# Patient Record
Sex: Male | Born: 1935
Health system: Southern US, Community
[De-identification: ages and names within clinical notes are randomized; demographics above are authoritative.]

## PROBLEM LIST (undated history)

## (undated) ENCOUNTER — Emergency Department (HOSPITAL_COMMUNITY): Payer: Medicare Other

## (undated) DIAGNOSIS — F0390 Unspecified dementia without behavioral disturbance: Secondary | ICD-10-CM

## (undated) DIAGNOSIS — I10 Essential (primary) hypertension: Secondary | ICD-10-CM

## (undated) DIAGNOSIS — E119 Type 2 diabetes mellitus without complications: Secondary | ICD-10-CM

## (undated) DIAGNOSIS — I2510 Atherosclerotic heart disease of native coronary artery without angina pectoris: Secondary | ICD-10-CM

## (undated) DIAGNOSIS — I4891 Unspecified atrial fibrillation: Secondary | ICD-10-CM

## (undated) HISTORY — DX: Unspecified atrial fibrillation: I48.91

## (undated) HISTORY — DX: Type 2 diabetes mellitus without complications: E11.9

## (undated) HISTORY — DX: Unspecified dementia, unspecified severity, without behavioral disturbance, psychotic disturbance, mood disturbance, and anxiety: F03.90

## (undated) HISTORY — PX: CORONARY ANGIOPLASTY: SHX604

## (undated) HISTORY — DX: Atherosclerotic heart disease of native coronary artery without angina pectoris: I25.10

## (undated) HISTORY — DX: Essential (primary) hypertension: I10

---

## 1999-03-17 ENCOUNTER — Ambulatory Visit (HOSPITAL_COMMUNITY): Admission: RE | Admit: 1999-03-17 | Discharge: 1999-03-17 | Payer: Self-pay | Admitting: *Deleted

## 2000-02-10 ENCOUNTER — Encounter: Payer: Self-pay | Admitting: Cardiology

## 2000-02-10 ENCOUNTER — Inpatient Hospital Stay (HOSPITAL_COMMUNITY): Admission: EM | Admit: 2000-02-10 | Discharge: 2000-02-13 | Payer: Self-pay | Admitting: Emergency Medicine

## 2000-03-05 ENCOUNTER — Encounter (HOSPITAL_COMMUNITY): Admission: RE | Admit: 2000-03-05 | Discharge: 2000-06-03 | Payer: Self-pay | Admitting: Cardiology

## 2000-05-09 ENCOUNTER — Encounter: Payer: Self-pay | Admitting: Emergency Medicine

## 2000-05-09 ENCOUNTER — Inpatient Hospital Stay (HOSPITAL_COMMUNITY): Admission: EM | Admit: 2000-05-09 | Discharge: 2000-05-15 | Payer: Self-pay | Admitting: Emergency Medicine

## 2007-12-19 ENCOUNTER — Encounter: Admission: RE | Admit: 2007-12-19 | Discharge: 2007-12-19 | Payer: Self-pay | Admitting: Family Medicine

## 2008-01-12 ENCOUNTER — Ambulatory Visit (HOSPITAL_COMMUNITY): Admission: RE | Admit: 2008-01-12 | Discharge: 2008-01-12 | Payer: Self-pay | Admitting: Gastroenterology

## 2010-12-19 NOTE — Op Note (Signed)
NAME:  Peter Martin, Peter Martin NO.:  [id number]   MEDICAL RECORD NO.:  [id number]           PATIENT TYPE:   LOCATION:                                 FACILITY:   PHYSICIAN:  Shirley Friar, MDDATE OF BIRTH:  Apr 05, 1930   DATE OF PROCEDURE:  DATE OF DISCHARGE:                               OPERATIVE REPORT   INDICATIONS:  Dysphagia, abnormal barium swallow which showed small  hiatal hernia with lodging of barium pill above the level of the hiatal  hernia.   MEDICINES:  1. Fentanyl 75 mcg IV.  2. Versed 7.5 mg IV.  3. Cetacaine spray.   FINDINGS:  Endoscope was inserted through the oropharynx and esophagus  was intubated.  The distal esophagus was with circumferential benign-  appearing ring, which initially prevented passage of the 9.8-mm  endoscope.  Moderate pressure was applied with this endoscope and the  endoscope was able to traverse through the esophageal ring with evidence  of dilation from the endoscope.  Endoscope was advanced down to the  stomach, which revealed slightly hypopigmented mucosa in the distal  stomach, but no ulcers or erosions were seen.  Retroflexion was done,  which revealed small hiatal hernia and active oozing of blood from the  GE junction, status post dilation with the endoscope.  Endoscope was  advanced down to the duodenal bulb and second portion of the duodenum,  which were both normal.  Endoscope was withdrawn back in the stomach and  a guidewire was inserted through the working channel of the endoscope in  an one-to-one fashion.  Fluoroscopy was used to help with placement of  the guidewire as well as with dilation of the esophageal ring.  Savary  dilators were used starting with 11 mm followed by 12.8 mm and then 14  mm with use of fluoroscopy and traversing of the GE junction.  Heme was  noted on all 3 dilators with minimal resistance noted on the 12.8-mm and  14-mm dilators.  The patient tolerated the procedure well with  no  immediate complications.   ASSESSMENT:  1. Distal esophageal ring, status post Savary dilation with      fluoroscopy up to 14 mm.  2. Small hiatal hernia.   PLAN:  1. Liquid diet and advance as tolerated.  2. The patient needs to start a proton pump inhibitor each day and may      need it to stay on indefinitely.  3. Follow up as needed depending on symptoms.     Shirley Friar, MD  Electronically Signed    VCS/MEDQ  D:  01/12/2008  T:  01/13/2008  Job:  [id number]   cc:   Windle Guard, M.D.

## 2010-12-22 NOTE — H&P (Signed)
[place]. [facility]  Patient:    Peter Martin, Peter Martin                         MRN: [id number] Adm. Date:  [id number] Attending:  Norman Clay CC:         Darden Palmer., M.D.  Hadassah Pais. Jeannetta Nap, M.D.   History and Physical  REASON FOR ADMISSION:  Chest tightness/unstable angina.  HISTORY OF PRESENT ILLNESS:  Mr. Boule is a 77 year old male with known ASCVD, including a 70-80% second diagonal stenosis, calcified 50% lesion in the circumflex.  He developed mid- to upper substernal chest tightness radiating across the chest around 3 oclock this afternoon.  It continued intermittently until now in the emergency room at approximately 1830.  It is associated with mild dyspnea and "head fullness," which is typical for him.  These symptoms began in July, resulting in a cardiac catheterization with the above findings. A decision at the time was made for medical therapy, including Norvasc, metoprolol, aspirin, and Plavix.  Despite this, he has had several episodes of discomfort, sometimes relieved with nitroglycerin but on other occasions nitroglycerin has caused profound weakness and lightheadedness. Interestingly, he is currently attending cardiac rehab and exercising without symptoms routinely.  PAST MEDICAL HISTORY: 1. Hypertension. 2. Hyperglycemia without a full diagnosis of diabetes mellitus. 3. Peptic ulcer disease/gastritis while on aspirin with subsequent GI    bleeding. 4. Hypercholesterolemia.  PAST SURGICAL HISTORY:  None.  SOCIAL HISTORY:  The patient is married and accompanied by his wife in the emergency room this afternoon.  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  He is a nonsmoker, does not drink any alcohol.  They live in a single-family home in the _____ area.  FAMILY HISTORY:  Father died at age 56 with stroke.  Mother died of heart failure at age 2.  No family history of early coronary disease.  MEDICATIONS:   Univasc 15 mg q.d., Norvasc 10 mg p.o. q.d., Lopressor 50 mg p.o. b.i.d., _____ p.o. q.d., aspirin ? dosage p.o. q.d., Plavix 75 mg p.o. q.d., SL nitroglycerin 0.4 mg p.r.n.  DRUG ALLERGIES:  PENICILLIN causes a rash.  REVIEW OF SYSTEMS:  The patient denies any recent fever or chills, malaise, weight loss, or weight gain.  He has the head fullness previously mentioned but no frequent problem with headaches.  He has poor vision in both eyes and wears glasses.  Hearing in both ears is good.  Teeth and gums in good repair. No difficulty with swallowing.  No history of hematemesis, hematochezia, or melena.  Does have a lot of abdominal fullness, "gas," currently.  He has no problems with dysuria or hematuria, no nocturia, difficulty starting or stopping the stream.  He has no muscle weakness or major joint pain or swelling or stiffness.  Denies any dysesthesias or paresthesias.  No history of seizure disorder or stroke.  PHYSICAL EXAMINATION:  VITAL SIGNS:  The blood pressure is 187/90 on admission, temperature 98.0, respirations 16, pulse 60, and frequent extrasystoles.  GENERAL:  This is a 77 year old man who is alert and oriented and cooperative, in no distress.  HEENT:  Head is atraumatic and normocephalic.  Pupils equal, round and reactive to light and accommodation.  A faint arcus senilis is present. Extraocular movements are intact.  Sclerae are anicteric.  Oral mucosa is pink and moist.  Teeth and gums in good repair.  The tongue is not coated.  NECK:  Supple without thyromegaly or masses.  Carotid upstrokes are normal. There is no bruit.  There is no jugular venous distention.  CHEST:  Clear with adequate excursion bilaterally, normal vesicular breath sounds are heard throughout.  HEART:  The precordium is quiet.  Normal S1 and S2 is heard.  Frequent extrasystole.  Occasional bigeminal rhythm.  No murmur, click, or rub.  ABDOMEN:  Soft, flat, nontender, no  hepatosplenomegaly or midline pulsatile masses.  Bowel sounds are present in all quadrants.  GENITALIA:  Normal male phallus.  Descended testicles.  No lesions.  EXTREMITIES:  Full range of motion, no edema.  Intact femoral, popliteal, and posterior tibial pulses bilaterally.  No bruits.  RECTAL:  Not performed.  NEUROLOGIC:  Cranial nerves II-XII are grossly intact.  Motor and sensory grossly intact.  Gait not tested.  SKIN:  Warm, dry, and clear.  ACCESSORY CLINICAL DATA:  Admission hemogram shows a hematocrit of 40.4, hemoglobin 14.5, white blood cell count 6000, platelet count 170,000.  Total CK 75, MB 1.5, troponin I 0.01.  Serum electrolytes showed a sodium to be 140, potassium 3.4, chloride 101, creatinine 1.2, BUN 23, glucose 93.  Electrocardiogram shows sinus bradycardia, LVH voltage, and T-wave inversion in V1 to V3.  There are Q-waves in V5 and V6, not felt to be significant. Chest x-ray is pending.  FINAL IMPRESSION:  A 77 year old man with known atherosclerotic cardiovascular disease, who presents with atypical angina in an unstable pattern.  Also hypertensive and relates a history of what sounds like significant blood pressure drops with sublingual nitroglycerin administration.  PLAN:  Admit for rule-out of myocardial infarction with serial CK-MB and troponin enzymes, repeat ECG, IV nitroglycerin, subcutaneous Lovenox, aspirin, beta blocker, and Norvasc.  Further evaluation and treatment to be based on re-evaluation by Dr. Viann Fish. DD:  05/09/00 TD:  05/10/00 Job: [id number]

## 2010-12-22 NOTE — Discharge Summary (Signed)
[place]. [facility]  Patient:    Peter Martin, Peter Martin                         MRN: [id number] Adm. Date:  [id number] Disc. Date: [id number] Attending:  Norman Clay CC:         Hadassah Pais. Jeannetta Nap, M.D.   Referring Physician Discharge [place]  FINAL DIAGNOSES 1. Unstable angina pectoris.    a. Rotablator and percutaneous transluminal coronary angioplasty of the       diagonal branch of the left anterior descending coronary artery. 2. Hypertension. 3. Hyperglycemia without a prior diagnosis of diabetes. 4. History of peptic ulcer disease and gastritis while on aspirin in the past.  PROCEDURES:  Angioplasty and Rotablator treatment of the diagonal branch.  HISTORY:  This 77 year old male has known ASCVD with a 70-80% calcified second diagonal stenosis and who developed substernal chest discomfort.  He was hospitalized in July with chest discomfort and head fullness and decision was made to treat him medically at that time, reserving intervention for symptoms that broke through medical therapy.  He has had episodic angina since then but not much until the day of admission, when he developed recurrent chest discomfort with profound weakness and lightheadedness following nitroglycerin administration.  He was admitted to rule out an MI.  Please see the previously dictated history and physical for remainder of the details.  HOSPITAL COURSE:  His laboratory studies showed a hemoglobin of 14.5 and hematocrit 40.4 on admission.  Potassium was 3.4.  Glucose was 93 on admission.  CPK and troponins were normal and PT and PTT were normal.  EKG showed sinus with occasional PVCs, no acute abnormality.  Chest x-ray showed findings consistent with COPD with hyperinflation.  The patient was admitted to the hospital and was watched over the weekend.  He was placed on Lovenox as well as nitroglycerin.  Enzymes were normal.  He developed recurrent chest pain with ambulation  and it was very difficult to tell the etiology of this.  After having recurrent chest discomfort despite anticoagulation and nitroglycerin in the hospital, it was recommended that patient be considered for Rotablator treatment of the second diagonal branch. He underwent this on May 14, 2000 by Dr. Vesta Mixer, Montez Hageman., with Rotablator treatment and subsequent balloon angioplasty with a 2.25 balloon, with an excellent angiographic result.  The stenosis went from 80% calcified stenosis to 30%.  He was somewhat weak the morning following the procedure and had atypical chest pain but no recurrence of his angina.  He and his wife were noted to be excessively anxious.  He remained stable following the procedure and was discharged that afternoon in improved condition.  DISCHARGE MEDICATIONS 1. Ecotrin 325 mg daily. 2. Metoprolol 50 mg b.i.d. 3. Univasc 15 mg daily. 4. Norvasc 10 mg daily. 5. Plavix 75 mg daily. 6. Pravachol 40 mg daily. 7. Protonix 40 mg daily.  SPECIAL INSTRUCTIONS:  He is to walk daily and is to call if there are further problems.  FOLLOWUP:  He is to be seen in the office in one week for followup. DD:  05/15/00 TD:  05/15/00 Job: [id number]

## 2010-12-22 NOTE — Cardiovascular Report (Signed)
[place]. [facility]  Patient:    Peter Martin, Peter Martin                         MRN: [id number] Proc. Date: 02/12/00 Adm. Date:  [id number] Attending:  Norman Clay CC:         Candelaria Stagers, M.D.                        Cardiac Catheterization  HISTORY:  The patient is a 77 year old male with a prior history of hypertension who presented with chest tightness and possible unstable angina, MI ruled out.  COMMENTS ABOUT PROCEDURE:  The patient tolerated the procedure well without complications.  Following the procedure a right femoral angiogram was obtained and the artery underwent percutaneous suture mediated closure of the renal failure using the Perclose device.  He tolerated the procedure well.  HEMODYNAMIC DATA:  Aorta post contrast 191/89, LV post contrast was 174/0-10.  ANGIOGRAPHIC DATA:  LEFT VENTRICULOGRAM:  The left ventriculogram performed in the 30 degree RAO projection.  The aortic valve was normal.  The mitral valve was normal.  The left ventricle appears normal in size.  Wall motion is normal.  Ejection fraction estimated at 60%.  Coronary arteries arise and distribute normally. The coronary arteries are heavily calcified particularly in the left coronary system.  The left main coronary artery is calcified but appears normal.  The LAD descending is heavily calcified proximally.  There is mild to moderate 30-40% proximal narrowing present.  A small first diagonal branch is calcified and has diffuse 60-70% narrowing.  A larger second diagonal branch has a long segment of calcification and moderately severe 60-70% narrowing with a more focal 80-90% stenosis which is heavily calcified.  This is a moderate sized diagonal branch.  The circumflex coronary artery is a dominant vessel.  The first marginal branch has a calcified 30-40% proximal stenosis.  A very small second marginal branch has a 90% proximal stenosis but is a very small  vessel.  The distal vessel contained no significant disease.  The right coronary artery is heavily calcified in its ostium and is moderately diseased throughout its course.  It is a small only codominant vessel with a very small posterior descending artery.  IMPRESSION: 1. Normal left ventricular function. 2. Significant coronary artery disease with heavily calcified left anterior    descending.  Severe diffuse disease involving a second diagonal branch    which is heavily calcified, moderate disease elsewhere. 3. Successful Perclose treatment of the right femoral artery.  RECOMMENDATIONS:  Initial medical therapy, control of blood pressure.  If continues to have symptoms, could be considered for Rotablator of the diagonal branch. DD:  02/12/00 TD:  02/12/00 Job: [id number]

## 2010-12-22 NOTE — Cardiovascular Report (Signed)
[place]. [facility]  Patient:    Peter Martin, Peter Martin                         MRN: [id number] Proc. Date: 05/14/00 Adm. Date:  [id number] Disc. Date: [id number] Attending:  Norman Clay CC:         Darden Palmer., M.D.  Cardiac Catheterization Laboratory   Cardiac Catheterization  INDICATIONS:  Mr. Peak is a 77 year old gentleman with a history of chest pain.  He underwent heart catheterization back in July and was found to have a tight stenosis and a moderate sized diagonal branch.  He was treated medically but continued to have chest pain even despite cardiac rehabilitation.  He was referred for repeat heart catheterization and possible rotational atherectomy.  PROCEDURES:  Left heart catheterization with coronary angiography, rotational atherectomy and percutaneous transluminal coronary angioplasty of the first diagonal vessel.  DESCRIPTION OF PROCEDURE:  The right femoral artery was easily cannulated using the modified Seldinger technique.  HEMODYNAMICS:  The left ventricular pressure was 115/14 with an aortic pressure of 117/61.  ANGIOGRAPHY:  The left main coronary artery is very heavily calcified but is otherwise unremarkable.  There is no discrete stenosis.  The left circumflex artery is a very heavily calcified vessel.  It gives off two diagonal branches very early on.  The remainder of the LAD has mild to moderate irregularities throughout its course but nothing tighter than 30%. The very first diagonal vessel is a very small and almost insignificant vessel The second diagonal vessel is a moderate sized vessel approximately 2.25 mm in diameter.  There is a long diffuse narrowing between 75% and 80% stenosis. This lesion is very heavily calcified.  The left circumflex artery is a fairly large and dominant vessel.  It gives off a early first obtuse marginal artery.  This marginal artery has minor luminal irregularities.  The  circumflex continues around the AV groove and has only minor luminal irregularities.  The posterolateral segment artery and a moderate sized posterior descending artery are unremarkable.  The right coronary artery is relatively small and is nondominant.  There are diffuse irregularities between 30-40% throughout its course.  There is a very small posterolateral segment artery.  LEFT VENTRICULOGRAM:  The left ventriculogram was performed in a 30 RAO position.  It reveals overall normal left ventricular systolic function. There is no mitral regurgitation.  PERCUTANEOUS TRANSLUMINAL CORONARY ANGIOPLASTY PROCEDURE:  The left main was engaged using an 8 [place] Judkins left 4 guide.  A 300 cm Patriot wire was used to wire the first diagonal vessel.  Using a transient catheter, this wire was traded out for a rotafloppy wire.  The patient was given 3600 units of heparin followed by a second bolus of 1500 units of heparin.  A double bolus Integrilin drip was given.  A 1.25 mm bur was loaded up on the wire.  Two passes were made down the diagonal vessel at 177,000 RPM for 11 seconds and 7 seconds.  There was only minimal slowing.  Followup angiography after this revealed some improvement of the vessel lumen.  This bur was removed and a 1.5 mm bur was then placed on the wire.  Three runs were performed down the diagonal vessel at 180,000 RPM for 12 seconds, 10 seconds, 9 seconds.  This resulted in a marked improved of the vessel lumen with good flow.  Atropine 0.5 mg intravenously was given to help keep his heart  rate above 60.  Following this the rotafloppy wire was again traded out for the Patriot wire using the transient catheter.  At this point a 2.25 x 20 mm CrossSail balloon was positioned across the diagonal vessel in the area of a rotational atherectomy.  It was inflated up to 4 atmospheres for 48 seconds.  This resulted in marked improvement of the vessel lumen.  There is still a  long diffuse stenosis of approximately 30%.  Given the heavily calcified nature of the vessel it was decided not to inflate the balloon any larger.  We feel that there is some increased risk for dissection.  The patient remained stable throughout the procedure.  COMPLICATIONS:  None.  CONCLUSIONS: 1. Successful rotational atherectomy and percutaneous transluminal coronary    angioplasty of the first diagonal vessel. 2. Mild to moderate irregularities involving the other vessels. 3. Normal left ventricular systolic function. DD:  05/14/00 TD:  05/15/00 Job: [id number]

## 2010-12-22 NOTE — H&P (Signed)
[place]. [facility]  Patient:    Peter Martin, Peter Martin                         MRN: [id number] Adm. Date:  [id number] Attending:  Norman Clay CC:         Hadassah Pais. Jeannetta Nap, M.D., [facility]                         History and Physical  REASON FOR ADMISSION:  Chest tightness.  HISTORY:  The patient is a very pleasant 77 year old male admitted to rule out myocardial infarction or unstable angina.  The patient has a prior history of long standing hypertension since 1974.  He states that his blood pressure normally runs around 140 to 150 at home, maybe 90 to 95 diastolic.  He has been on Univasc and HCTZ.  He also notes some slightly elevated sugars in the past.  There is a history of gastritis due to taking aspirin several years ago resulting in blood loss according to the patient.  He and his wife had gone to the beach recently.  He noticed some fullness in his head and upper chest tightness that resolved after he went to the beach and he thought the problem was due to allergies.  He normally runs 15 to 20 miles per week without symptoms.  Yesterday, he began to mow his grass and had the onset of upper chest tightness and fullness associated with fullness in his head which got to the point where he had to stop his activity and exercise.  He went in and took an Actifed and laid down.  He may have had some improvement in the pain, but had some dull tightness overnight.  He worked this morning with persistent chest tightness which he thought might have been somewhat worse with activity. He described it as a tightness and fullness and went in to see Dr. Berline Chough in his office.  There is variable relief to nitroglycerin and he described it only as minimal and would wax and wane.  After consultation with Dr. Berline Chough over the phone, I recommended that patient be brought to the emergency room.  He was given nitroglycerin and complains of a  1 out of 10 type of chest discomfort.  He has not had previous exertional angina and as noted above has been quite healthy other than the hypertension.  PAST HISTORY:  Remarkable for hypertension since 1974, mild elevation of glucose, but no evidence of diabetes previously.  He does not think his cholesterol has been elevated.  There was a history of gastritis leading to bleeding several years ago.  He has also had dysphagia resulting in an endoscopy.  PREVIOUS SURGERY:  None.  ALLERGIES:  Allergic to PENICILLIN.  CURRENT MEDICATIONS:  Univasc and HCTZ.  FAMILY HISTORY:  Father died at age 26 of a stroke.  Mother died of heart failure at age 56.  There is no family history of premature cardiac disease.  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 He is a nonsmoker.  He does not drink alcohol.  He and his wife have two children and has been married approximately 50 years.  They live in the [place] area and attend [employer].  REVIEW OF SYSTEMS:  There have been no skin changes and no weight changes.  He has had no significant ENT  problems other than what was thought to be occasional allergies.  Dysphagia as noted above with endoscopy and colonoscopy within the past year.  He has no impotence, hematuria or hematochezia.  He has had some mild arthritis and has been taking chondroitin/glucosamine combination for, which he thinks has been due to running.  No claudication, no TIAs.  No psychiatric or endocrine problems.  The remainder of the review of systems is unremarkable except as noted above.  PHYSICAL EXAMINATION:  GENERAL:  He is a pleasant male who appeared younger than his stated age.  VITAL SIGNS:  His blood pressure was 133/81, pulse 60.  SKIN:  Warm and dry without lesions.  ENT:  EOMI.  PERRLA.  CNS clear.  Fundi were normal.  Pharynx negative.  NECK:  Supple without masses, thyromegaly or carotid  bruits.  LUNGS:  Clear to auscultation and percussion.  CARDIOVASCULAR:  Normal S1 and S2, no S3, S4 or murmur.  ABDOMEN:  Soft, nontender, no masses, no organomegaly.  EXTREMITIES:  His femoral and distal pulses were 2+.  There were no bruits noted.  There was no aneurysm noted.  NEUROLOGIC:  Normal.  LABORATORY DATA:  12-lead ECG shows an interventricular conduction delay, no significant ST abnormality, occasional PVCs.  Chest x-ray shows cardiomegaly.  There is calcification in the aortic knob.  IMPRESSION: 1. Chest tightness with some typical, other atypical features, rule out    unstable angina pectoris or myocardial infarction. 2. Hypertension which by history may have not been well controlled. 3. Elevation of glucose in the past. 4. History of gastritis.  RECOMMENDATIONS:  The patient will be placed on heparin and IV nitroglycerin, beta blockers, aspirin.  An MI will be ruled out.  Consideration of catheterization to diagnose whether he has coronary artery disease in the setting of unstable angina pectoris.  Watch carefully for bleeding or gastritis. DD:  02/10/00 TD:  02/10/00 Job: [id number]

## 2010-12-22 NOTE — Discharge Summary (Signed)
[place]. [facility]  Patient:    Peter Martin, Peter Martin                         MRN: [id number] Adm. Date:  [id number] Disc. Date: [id number] Attending:  Norman Clay CC:         Hadassah Pais. Jeannetta Nap, M.D.                           Discharge Summary  FINAL DIAGNOSES: 1. Unstable angina. 2. Coronary artery disease with calcified coronary arteries and heavily    calcified diagonal artery stenosis. 3. Hypertension. 4. History of gastritis due to bleeding previously.  PROCEDURES:  Cardiac catheterization.  HISTORY OF PRESENT ILLNESS:  This 77 year old male has a known history of hypertension and slightly elevated sugars in the past.  He has a history of gastritis, taking aspirin several years ago, resulting in blood loss.  He noted fullness in his head and upper chest tightness that resolved after he went to the beach.  He thought that the problem was due to allergies.  He normally runs 15-20 miles per week without symptoms.  The day prior to admission, he was mowing his grass and had the onset of upper chest tightness and fullness associated with fullness in his head, which got to the point where he took an Actifed and laid down.  He may have had some improvement in his pain, but had dull tightness overnight.  He worked the morning of admission with persistent chest tightness and was seen at Dr. Hadassah Pais. [place] office by his partner, Dr. Berline Chough, and was sent up to the hospital where he was admitted to rule out a MI.  Please see the previously dictated history and physical for the remainder of the details.  LABORATORY DATA:  Lab studies on admission showed a hemoglobin of 13.9 and a hematocrit of 38.8.  The PT and PTT were normal.  The sodium was 134 and the potassium was 3.4.  The hemoglobin A1C was 6.1.  The CPK-MBs were normal.  The lipid panel showed a cholesterol of 195, triglycerides of 113, an HDL of 43, and an LDL of 129.  HOSPITAL COURSE:  The  patient was placed on IV heparin and catheterization was done on February 12, 2000.  The coronary arteries were heavily calcified.  The left main coronary artery was calcified.  The LAD was heavily calcified proximally with mild narrowing.  The second diagonal branch had a segmental 70-80% long stenosis noted.  The circumflex was dominant with a 50% calcified stenosis involving the marginal and the intermediate branch.  The RCA was codominant with moderate narrowing.  The patient was observed and his artery underwent percutaneous closure without difficulty.  He was ambulatory in the hall without significant residual chest tightness and was sent home the next day.  It was thought that we would initially try medical therapy as his lesion was not suitable for angioplasty or stenting.  If he continues to have symptoms, he can be considered for rotational atherectomy.  DISPOSITION:  He was discharged in improved condition.  DISCHARGE MEDICATIONS: 1. Univasc 15 mg. 2. Lopressor 50 mg b.i.d. 3. Norvasc 10 mg daily. 4. HCTZ 25 mg daily. 5. Aspirin daily. 6. Plavix 75 mg daily. 7. Nitroglycerin as needed.  FOLLOW-UP:  He is to call the office and have a Cardiolite done next week to determine his exercise capacity and whether  he is ischemic.  He was also given a referral to the rehabilitation program. DD:  02/26/00 TD:  02/28/00 Job: [id number]

## 2012-02-19 ENCOUNTER — Other Ambulatory Visit: Payer: Self-pay | Admitting: Family Medicine

## 2012-04-02 ENCOUNTER — Other Ambulatory Visit: Payer: Self-pay | Admitting: Oncology

## 2017-03-22 DIAGNOSIS — E785 Hyperlipidemia, unspecified: Secondary | ICD-10-CM | POA: Diagnosis not present

## 2017-03-22 DIAGNOSIS — I493 Ventricular premature depolarization: Secondary | ICD-10-CM | POA: Diagnosis not present

## 2017-03-22 DIAGNOSIS — N183 Chronic kidney disease, stage 3 (moderate): Secondary | ICD-10-CM | POA: Diagnosis not present

## 2017-03-22 DIAGNOSIS — I119 Hypertensive heart disease without heart failure: Secondary | ICD-10-CM | POA: Diagnosis not present

## 2017-03-22 DIAGNOSIS — I2510 Atherosclerotic heart disease of native coronary artery without angina pectoris: Secondary | ICD-10-CM | POA: Diagnosis not present

## 2017-03-22 DIAGNOSIS — Z9861 Coronary angioplasty status: Secondary | ICD-10-CM | POA: Diagnosis not present

## 2017-03-22 DIAGNOSIS — I351 Nonrheumatic aortic (valve) insufficiency: Secondary | ICD-10-CM | POA: Diagnosis not present

## 2017-03-25 DIAGNOSIS — I359 Nonrheumatic aortic valve disorder, unspecified: Secondary | ICD-10-CM | POA: Diagnosis not present

## 2017-07-08 DIAGNOSIS — Z23 Encounter for immunization: Secondary | ICD-10-CM | POA: Diagnosis not present

## 2019-05-26 DIAGNOSIS — E039 Hypothyroidism, unspecified: Secondary | ICD-10-CM | POA: Diagnosis not present

## 2019-05-26 DIAGNOSIS — R413 Other amnesia: Secondary | ICD-10-CM | POA: Diagnosis not present

## 2019-05-26 DIAGNOSIS — Z23 Encounter for immunization: Secondary | ICD-10-CM | POA: Diagnosis not present

## 2019-06-16 DIAGNOSIS — R7301 Impaired fasting glucose: Secondary | ICD-10-CM | POA: Diagnosis not present

## 2019-06-23 DIAGNOSIS — E119 Type 2 diabetes mellitus without complications: Secondary | ICD-10-CM | POA: Diagnosis not present

## 2019-08-28 DIAGNOSIS — N183 Chronic kidney disease, stage 3 unspecified: Secondary | ICD-10-CM | POA: Diagnosis not present

## 2019-08-28 DIAGNOSIS — E119 Type 2 diabetes mellitus without complications: Secondary | ICD-10-CM | POA: Diagnosis not present

## 2019-08-28 DIAGNOSIS — I10 Essential (primary) hypertension: Secondary | ICD-10-CM | POA: Diagnosis not present

## 2019-09-01 DIAGNOSIS — R300 Dysuria: Secondary | ICD-10-CM | POA: Diagnosis not present

## 2019-09-01 DIAGNOSIS — R2681 Unsteadiness on feet: Secondary | ICD-10-CM | POA: Diagnosis not present

## 2019-11-24 DIAGNOSIS — R7301 Impaired fasting glucose: Secondary | ICD-10-CM | POA: Diagnosis not present

## 2019-11-24 DIAGNOSIS — R413 Other amnesia: Secondary | ICD-10-CM | POA: Diagnosis not present

## 2019-11-24 DIAGNOSIS — I499 Cardiac arrhythmia, unspecified: Secondary | ICD-10-CM | POA: Diagnosis not present

## 2019-11-26 ENCOUNTER — Other Ambulatory Visit: Payer: Self-pay | Admitting: Family Medicine

## 2019-11-26 DIAGNOSIS — I4891 Unspecified atrial fibrillation: Secondary | ICD-10-CM

## 2019-11-27 ENCOUNTER — Ambulatory Visit: Payer: Medicare Other

## 2019-11-27 ENCOUNTER — Other Ambulatory Visit: Payer: Self-pay

## 2019-11-27 DIAGNOSIS — I4891 Unspecified atrial fibrillation: Secondary | ICD-10-CM

## 2019-12-16 NOTE — Progress Notes (Signed)
Date:  12/17/2019   ID:  Peter Martin, DOB 12-Jul-1930, MRN [id number]  PCP:  Kaleen Mask, MD  Cardiologist: Tessa Lerner, DO, [facility] (established care 12/17/2019) Former Cardiologist: Dr. Donnie Aho.   REASON FOR CONSULT: Atrial fibrillation  REQUESTING PHYSICIAN:  Kaleen Mask, MD [address] [place],  [place] [address]  Chief Complaint  Patient presents with  . Atrial Fibrillation    echo results    HPI  Peter Martin is a 86 y.o. male who is being seen today for the evaluation of atrial fibrillation at the request of Dr. Jeannetta Nap. Patient's past medical history and cardiac risk factors include: History of coronary artery disease status post arthrectomy and angioplasty, hypertension, non-insulin-dependent diabetes mellitus type 2, advanced age, mild aortic stenosis and moderate MR/TR.  Patient is accompanied by her daughter Gavin Pound at today's visit.  Patient has been an avid runner up until 2014 and since then he remains physically active.  He is currently being cared by both his son and daughter.  They present to the office for evaluation and management of atrial fibrillation at the request of his primary care provider.  According the patient's daughter he was diagnosed with atrial fibrillation back in April 2021.  He recently had an echocardiogram at this office and results were reviewed with both the patient and his daughter at today's visit and noted below for further reference.  Patient is currently not on AV nodal blocking agents and his ventricular rate is well controlled.  Patient is currently not on oral anticoagulation for thromboembolic prophylaxis.  No history of falls.  No history of internal bleeding or intracranial bleeding per patient's daughter.  And currently does not endorse any evidence of bleeding.  No recent surgeries.  History of  coronary artery disease status post arthrectomy and angioplasty. Denies prior history of myocardial infarction,  congestive heart failure, deep venous thrombosis, pulmonary embolism, stroke, transient ischemic attack.  FUNCTIONAL STATUS: He goes out to walk on a daily basis with his daughter and they walk for several blocks.   ALLERGIES: No Known Allergies  MEDICATION LIST PRIOR TO VISIT: Current Meds  Medication Sig  . acetaminophen (TYLENOL) 325 MG tablet Take 650 mg by mouth every 6 (six) hours as needed.     PAST MEDICAL HISTORY: Past Medical History:  Diagnosis Date  . Atrial fibrillation (HCC)   . Coronary artery disease   . Dementia (HCC)   . Hypertension     PAST SURGICAL HISTORY: Past Surgical History:  Procedure Laterality Date  . CORONARY ANGIOPLASTY      FAMILY HISTORY: The patient family history is not on file.  SOCIAL HISTORY:  The patient  reports that he has never smoked. He has never used smokeless tobacco. He reports that he does not drink alcohol or use drugs.  REVIEW OF SYSTEMS: Review of Systems  Constitution: Negative for chills and fever.  HENT: Negative for hoarse voice and nosebleeds.   Eyes: Negative for discharge, double vision and pain.  Cardiovascular: Negative for chest pain, claudication, dyspnea on exertion, leg swelling, near-syncope, orthopnea, palpitations, paroxysmal nocturnal dyspnea and syncope.  Respiratory: Negative for hemoptysis and shortness of breath.   Musculoskeletal: Negative for muscle cramps and myalgias.  Gastrointestinal: Negative for abdominal pain, constipation, diarrhea, hematemesis, hematochezia, melena, nausea and vomiting.  Neurological: Negative for dizziness and light-headedness.    PHYSICAL EXAM: Vitals with BMI 12/17/2019 12/17/2019 12/17/2019  Weight - - 156 lbs  Systolic 170 199 626  Diastolic 80 89 100  Pulse 60 50 61    CONSTITUTIONAL: Well-developed and well-nourished. No acute distress.  SKIN: Skin is warm and dry. No rash noted. No cyanosis. No pallor. No jaundice HEAD: Normocephalic and atraumatic.  EYES:  No scleral icterus MOUTH/THROAT: Moist oral membranes.  NECK: No JVD present. No thyromegaly noted. No carotid bruits   LYMPHATIC: No visible cervical adenopathy.  CHEST Normal respiratory effort. No intercostal retractions  LUNGS: Clear to auscultation bilaterally.  No stridor. No wheezes. No rales.  CARDIOVASCULAR: Irregularly irregular, bradycardic, variable S1 and S2, ejection murmur heard at the second right intercostal space, systolic murmur heard at the apex, no gallops or rubs. ABDOMINAL: Nonobese, soft, nontender, nondistended, positive bowel sounds in all 4 quadrants, no apparent ascites.  EXTREMITIES: No peripheral edema  HEMATOLOGIC: No significant bruising NEUROLOGIC: Oriented to person, place, and time. Nonfocal. Normal muscle tone.  PSYCHIATRIC: Normal mood and affect. Normal behavior. Cooperative  CARDIAC DATABASE: EKG: 12/17/2019: Atrial fibrillation, 58 bpm, LVH per voltage criteria, ST-T changes most likely secondary to underlying LVH.  Echocardiogram: 11/27/2019: LVEF 67%, unable to evaluate diastolic function secondary to atrial fibrillation, normal wall motion, mild LVH, mild aortic stenosis, mild AR, moderate MR moderate TR, mild PR, RVSP 31 mmHg.  Stress Testing: None  Heart Catheterization: 05/14/2000: INDICATIONS:  Mr. Staton is a 86 year old gentleman with a history of chest pain.  He underwent heart catheterization back in July and was found to have a tight stenosis and a moderate sized diagonal branch.  He was treated medically but continued to have chest pain even despite cardiac rehabilitation.  He was referred for repeat heart catheterization and possible rotational atherectomy. Conclusions: 1. Successful rotational atherectomy and percutaneous transluminal coronary  angioplasty of the first diagonal vessel. 2. Mild to moderate irregularities involving the other vessels. 3. Normal left ventricular systolic function.  LABORATORY DATA:  External Labs:    11/24/2019: Creatinine 1.4 mg/dL. GFR 44  AST 81 ALT 38 Hemoglobin 15.4 g/dL TSH 4.33. Hemoglobin A1c 6.8  IMPRESSION:    ICD-10-CM   1. Persistent atrial fibrillation (HCC)  I48.19 EKG 12-Lead  2. Essential hypertension  I10 amLODipine (NORVASC) 5 MG tablet  3. Non-insulin dependent type 2 diabetes mellitus ([place])  E11.9   4. Atherosclerosis of native coronary artery of native heart without angina pectoris  I25.10   5. History of angioplasty  Z98.62      RECOMMENDATIONS: Peter Martin is a 86 y.o. male whose past medical history and cardiac risk factors include: History of coronary artery disease status post arthrectomy and angioplasty, hypertension, non-insulin-dependent diabetes mellitus type 2, advanced age, mild aortic stenosis and moderate MR/TR.  Persistent atrial fibrillation:  Currently rate controlled and not on AV nodal blocking agents.  Rhythm control: N/A.  Thromboembolic prophylaxis: None.  I had a long and detailed discussion with the patient and his daughter Neoma Laming regarding the incidence, etiology, pathophysiology, prognosis, and therapeutic options for atrial fibrillation.  Specifically we discussed oral anticoagulation for stroke prevention and also discussed the Watchman device.  I spent a considerable amount of time reviewing Afib, natural history, pathophysiology, rate control vs rhythm control strategy and risk for stroke.    CHA2DS2-VASc SCORE is 5 which correlates to 6.7% risk of stroke per year (hypertension, age, diabetes, established CAD).  I reviewed with patient the benefits and risks of initiating/continuing anticoagulation rx, and based on afib treatment guidelines, I recommended long-term anticoagulation for stroke prevention as long as they understand the risks, benefits, and alternatives to anticoagulation.  Patient and his  daughter would like to discuss this further with the patient's son prior to initiating anticoagulation.  Obtained  external labs from his PCPs office during the office visit findings noted above.  TSH within normal limits.  Given the patient's history of CAD and newly discovered atrial fibrillation we also discussed undergoing an ischemic evaluation.  At this time patient does not want to proceed with stress test as he remains asymptomatic.  Echocardiogram results reviewed with the patient and his daughter at today's visit.  I would like to see him back in 2 weeks to address any questions he may have in regards to the management of atrial fibrillation.  Benign essential hypertension:  Currently managed per primary team.  Patient's blood pressure on arrival was elevated and remained elevated despite rechecking it several times.  In regards to the safety of the patient will initiate Norvasc 5 mg p.o. daily.  Patient is asked to keep a log of his blood pressures and to review them with his primary care provider to see if additional medical therapy is necessary.  Will defer further management to primary team.  Established coronary artery disease with prior rotational atherectomy and angioplasty.  Currently asymptomatic.  Recommend aspirin 81 mg p.o. daily.  Given the newly discovered atrial fibrillation and known history of CAD recommended ischemic evaluation.  However patient would like to hold off on this at the current time.  Non-insulin-dependent diabetes mellitus type 2: Currently managed by primary team.  FINAL MEDICATION LIST END OF ENCOUNTER: Meds ordered this encounter  Medications  . amLODipine (NORVASC) 5 MG tablet    Sig: Take 1 tablet (5 mg total) by mouth in the morning.    Dispense:  30 tablet    Refill:  0    There are no discontinued medications.   Current Outpatient Medications:  .  acetaminophen (TYLENOL) 325 MG tablet, Take 650 mg by mouth every 6 (six) hours as needed., Disp: , Rfl:  .  amLODipine (NORVASC) 5 MG tablet, Take 1 tablet (5 mg total) by mouth in the  morning., Disp: 30 tablet, Rfl: 0 .  donepezil (ARICEPT) 5 MG tablet, Take 1 tablet by mouth daily. hasnt started yet, Disp: , Rfl:   Orders Placed This Encounter  Procedures  . EKG 12-Lead   --Continue cardiac medications as reconciled in final medication list. --Return in about 2 weeks (around 12/31/2019) for afib follow up.. Or sooner if needed. --Continue follow-up with your primary care physician regarding the management of your other chronic comorbid conditions.  Patient's questions and concerns were addressed to his satisfaction. He voices understanding of the instructions provided during this encounter.   This note was created using a voice recognition software as a result there may be grammatical errors inadvertently enclosed that do not reflect the nature of this encounter. Every attempt is made to correct such errors.  Tessa Lerner, [place], [facility]  Pager: [phone] Office: [phone]

## 2019-12-17 ENCOUNTER — Ambulatory Visit: Payer: Medicare Other | Admitting: Cardiology

## 2019-12-17 ENCOUNTER — Encounter: Payer: Self-pay | Admitting: Cardiology

## 2019-12-17 ENCOUNTER — Other Ambulatory Visit: Payer: Self-pay

## 2019-12-17 VITALS — BP 170/80 | HR 60 | Wt 156.0 lb

## 2019-12-17 DIAGNOSIS — I2510 Atherosclerotic heart disease of native coronary artery without angina pectoris: Secondary | ICD-10-CM | POA: Diagnosis not present

## 2019-12-17 DIAGNOSIS — I4819 Other persistent atrial fibrillation: Secondary | ICD-10-CM | POA: Diagnosis not present

## 2019-12-17 DIAGNOSIS — E119 Type 2 diabetes mellitus without complications: Secondary | ICD-10-CM | POA: Diagnosis not present

## 2019-12-17 DIAGNOSIS — Z9862 Peripheral vascular angioplasty status: Secondary | ICD-10-CM | POA: Diagnosis not present

## 2019-12-17 DIAGNOSIS — I10 Essential (primary) hypertension: Secondary | ICD-10-CM

## 2019-12-17 MED ORDER — AMLODIPINE BESYLATE 5 MG PO TABS: 5.0000 mg | ORAL_TABLET | Freq: Every morning | ORAL | 0 refills | Status: DC

## 2019-12-17 NOTE — Patient Instructions (Signed)
d 

## 2020-01-06 DIAGNOSIS — I4891 Unspecified atrial fibrillation: Secondary | ICD-10-CM | POA: Diagnosis not present

## 2020-01-07 ENCOUNTER — Ambulatory Visit (INDEPENDENT_AMBULATORY_CARE_PROVIDER_SITE_OTHER): Payer: Medicare Other | Admitting: Podiatry

## 2020-01-07 ENCOUNTER — Encounter: Payer: Self-pay | Admitting: Cardiology

## 2020-01-07 ENCOUNTER — Ambulatory Visit: Payer: Medicare Other | Admitting: Cardiology

## 2020-01-07 ENCOUNTER — Other Ambulatory Visit: Payer: Self-pay

## 2020-01-07 ENCOUNTER — Encounter: Payer: Self-pay | Admitting: Podiatry

## 2020-01-07 VITALS — BP 172/85 | HR 46

## 2020-01-07 VITALS — BP 160/80 | HR 56 | Ht 71.0 in | Wt 156.0 lb

## 2020-01-07 DIAGNOSIS — Z9862 Peripheral vascular angioplasty status: Secondary | ICD-10-CM

## 2020-01-07 DIAGNOSIS — I2510 Atherosclerotic heart disease of native coronary artery without angina pectoris: Secondary | ICD-10-CM | POA: Diagnosis not present

## 2020-01-07 DIAGNOSIS — I4819 Other persistent atrial fibrillation: Secondary | ICD-10-CM

## 2020-01-07 DIAGNOSIS — I10 Essential (primary) hypertension: Secondary | ICD-10-CM | POA: Diagnosis not present

## 2020-01-07 DIAGNOSIS — L03116 Cellulitis of left lower limb: Secondary | ICD-10-CM

## 2020-01-07 DIAGNOSIS — B351 Tinea unguium: Secondary | ICD-10-CM | POA: Diagnosis not present

## 2020-01-07 DIAGNOSIS — R6889 Other general symptoms and signs: Secondary | ICD-10-CM

## 2020-01-07 DIAGNOSIS — I739 Peripheral vascular disease, unspecified: Secondary | ICD-10-CM

## 2020-01-07 DIAGNOSIS — E119 Type 2 diabetes mellitus without complications: Secondary | ICD-10-CM | POA: Diagnosis not present

## 2020-01-07 DIAGNOSIS — E1151 Type 2 diabetes mellitus with diabetic peripheral angiopathy without gangrene: Secondary | ICD-10-CM

## 2020-01-07 DIAGNOSIS — M79674 Pain in right toe(s): Secondary | ICD-10-CM

## 2020-01-07 DIAGNOSIS — L97521 Non-pressure chronic ulcer of other part of left foot limited to breakdown of skin: Secondary | ICD-10-CM | POA: Diagnosis not present

## 2020-01-07 DIAGNOSIS — M79675 Pain in left toe(s): Secondary | ICD-10-CM

## 2020-01-07 MED ORDER — MUPIROCIN 2 % EX OINT
1.0000 | TOPICAL_OINTMENT | Freq: Two times a day (BID) | CUTANEOUS | 2 refills | Status: DC
Start: 2020-01-07 — End: 2020-05-11

## 2020-01-07 MED ORDER — DOXYCYCLINE HYCLATE 100 MG PO TABS: 100.0000 mg | ORAL_TABLET | Freq: Two times a day (BID) | ORAL | 0 refills | Status: DC

## 2020-01-07 NOTE — Patient Instructions (Addendum)
I have ordered a circulation test for your feet. If you do not hear for them about scheduling within the next 1 week, or you have any questions please give [place] a call at [phone].    Diabetes Mellitus and Foot Care Foot care is an important part of your health, especially when you have diabetes. Diabetes may cause you to have problems because of poor blood flow (circulation) to your feet and legs, which can cause your skin to:  Become thinner and drier.  Break more easily.  Heal more slowly.  Peel and crack. You may also have nerve damage (neuropathy) in your legs and feet, causing decreased feeling in them. This means that you may not notice minor injuries to your feet that could lead to more serious problems. Noticing and addressing any potential problems early is the best way to prevent future foot problems. How to care for your feet Foot hygiene  Wash your feet daily with warm water and mild soap. Do not use hot water. Then, pat your feet and the areas between your toes until they are completely dry. Do not soak your feet as this can dry your skin.  Trim your toenails straight across. Do not dig under them or around the cuticle. File the edges of your nails with an emery board or nail file.  Apply a moisturizing lotion or petroleum jelly to the skin on your feet and to dry, brittle toenails. Use lotion that does not contain alcohol and is unscented. Do not apply lotion between your toes. Shoes and socks  Wear clean socks or stockings every day. Make sure they are not too tight. Do not wear knee-high stockings since they may decrease blood flow to your legs.  Wear shoes that fit properly and have enough cushioning. Always look in your shoes before you put them on to be sure there are no objects inside.  To break in new shoes, wear them for just a few hours a day. This prevents injuries on your feet. Wounds, scrapes, corns, and calluses  Check your feet daily for blisters, cuts,  bruises, sores, and redness. If you cannot see the bottom of your feet, use a mirror or ask someone for help.  Do not cut corns or calluses or try to remove them with medicine.  If you find a minor scrape, cut, or break in the skin on your feet, keep it and the skin around it clean and dry. You may clean these areas with mild soap and water. Do not clean the area with peroxide, alcohol, or iodine.  If you have a wound, scrape, corn, or callus on your foot, look at it several times a day to make sure it is healing and not infected. Check for: ? Redness, swelling, or pain. ? Fluid or blood. ? Warmth. ? Pus or a bad smell. General instructions  Do not cross your legs. This may decrease blood flow to your feet.  Do not use heating pads or hot water bottles on your feet. They may burn your skin. If you have lost feeling in your feet or legs, you may not know this is happening until it is too late.  Protect your feet from hot and cold by wearing shoes, such as at the beach or on hot pavement.  Schedule a complete foot exam at least once a year (annually) or more often if you have foot problems. If you have foot problems, report any cuts, sores, or bruises to your health care provider  immediately. Contact a health care provider if:  You have a medical condition that increases your risk of infection and you have any cuts, sores, or bruises on your feet.  You have an injury that is not healing.  You have redness on your legs or feet.  You feel burning or tingling in your legs or feet.  You have pain or cramps in your legs and feet.  Your legs or feet are numb.  Your feet always feel cold.  You have pain around a toenail. Get help right away if:  You have a wound, scrape, corn, or callus on your foot and: ? You have pain, swelling, or redness that gets worse. ? You have fluid or blood coming from the wound, scrape, corn, or callus. ? Your wound, scrape, corn, or callus feels warm to  the touch. ? You have pus or a bad smell coming from the wound, scrape, corn, or callus. ? You have a fever. ? You have a red line going up your leg. Summary  Check your feet every day for cuts, sores, red spots, swelling, and blisters.  Moisturize feet and legs daily.  Wear shoes that fit properly and have enough cushioning.  If you have foot problems, report any cuts, sores, or bruises to your health care provider immediately.  Schedule a complete foot exam at least once a year (annually) or more often if you have foot problems. This information is not intended to replace advice given to you by your health care provider. Make sure you discuss any questions you have with your health care provider. Document Revised: 04/15/2019 Document Reviewed: 08/24/2016 Elsevier Patient Education  [place].

## 2020-01-07 NOTE — Progress Notes (Signed)
[place] Date of Birth: 1929-10-08 MRN: [id number] Primary Care Provider:Elkins, Curt Jews, MD Former Cardiologist: Dr. Wynonia Lawman.  Primary Cardiologist: Rex Kras, DO, FACC(established care 12/17/2019)  Date: 01/07/20 Last Office Visit: 12/17/2019  Chief Complaint  Patient presents with  . Atrial Fibrillation    HPI  Stefan Markarian is a 86 y.o. male who presents to the office with a  chief complaint of " discuss atrial fibrillation management."  His past medical history and cardiovascular risk factors are: History of coronary artery disease status post arthrectomy and angioplasty, hypertension, non-insulin-dependent diabetes mellitus type 2, advanced age, mild aortic stenosis and moderate MR/TR.  Patient was originally referred to the office for evaluation of his primary care provider for evaluation of atrial fibrillation.  Patient is accompanied by her daughter Neoma Laming at today's visit.   At the last office visit, we discussed the management of atrial fibrillation which includes rate control, rhythm control, and thromboembolic prophylaxis for stroke prevention.  Patient's ventricular rate is very well controlled and therefore no AV nodal blocking agents were recommended.  Currently patient is asymptomatic, ventricular rate is well controlled, and the duration of his atrial fibrillation is unknown and therefore antiarrhythmic medications were not recommended.  Because this was new onset of atrial fibrillation we also discussed undergoing stress test at the last office visit; however, patient's daughter wanted to hold off on this as he did not have any cardiac symptoms.  In regards to oral anticoagulation I have spent significant time with the patient and his daughter Neoma Laming at the last office visit in regards to discussing the advantages, disadvantages and alternatives to oral anticoagulation.   Since last office visit patient has discussed the need for oral anticoagulation with his  primary care provider.  Patient's daughter informs me that his primary care provider also encourages him to be on oral anticoagulation for thromboembolic prophylaxis.  Based on the conversation today with patient and his daughter as well as his brother are hesitant on oral anticoagulation.  CHA2DS2-VASc SCORE was rediscussed with the patient and his daughter at today's visit.  In the interim patient is also seen his podiatrist for a sore on his left lower extremity.  He had an ABI done earlier this morning at their office which was noted to be abnormal.  They are requesting an ultrasound of the lower extremities to be done to evaluate for peripheral artery disease.  History of  coronary artery disease status post arthrectomy and angioplasty. Denies prior history of myocardial infarction, congestive heart failure, deep venous thrombosis, pulmonary embolism, stroke, transient ischemic attack.  FUNCTIONAL STATUS: He goes out to walk on a daily basis with his daughter and they walk for several blocks.   ALLERGIES: No Known Allergies  MEDICATION LIST PRIOR TO VISIT: Current Meds  Medication Sig  . acetaminophen (TYLENOL) 325 MG tablet Take 650 mg by mouth every 6 (six) hours as needed.  Marland Kitchen amLODipine (NORVASC) 5 MG tablet Take 1 tablet (5 mg total) by mouth in the morning.  Marland Kitchen aspirin EC 81 MG tablet Take 81 mg by mouth daily.  Marland Kitchen donepezil (ARICEPT) 5 MG tablet Take 1 tablet by mouth daily. hasnt started yet  . doxycycline (VIBRA-TABS) 100 MG tablet Take 1 tablet (100 mg total) by mouth 2 (two) times daily.  . mupirocin ointment (BACTROBAN) 2 % Apply 1 application topically 2 (two) times daily.     PAST MEDICAL HISTORY: Past Medical History:  Diagnosis Date  . Atrial fibrillation ([place])   . Coronary  artery disease   . Dementia (HCC)   . Diabetes mellitus without complication (HCC)   . Hypertension     PAST SURGICAL HISTORY: Past Surgical History:  Procedure Laterality Date  . CORONARY  ANGIOPLASTY      FAMILY HISTORY: The patient family history is not on file.  SOCIAL HISTORY:  The patient  reports that he has never smoked. He has never used smokeless tobacco. He reports that he does not drink alcohol or use drugs.  REVIEW OF SYSTEMS: Review of Systems  Constitution: Negative for chills and fever.  HENT: Negative for hoarse voice and nosebleeds.   Eyes: Negative for discharge, double vision and pain.  Cardiovascular: Negative for chest pain, claudication, dyspnea on exertion, leg swelling, near-syncope, orthopnea, palpitations, paroxysmal nocturnal dyspnea and syncope.  Respiratory: Negative for hemoptysis and shortness of breath.   Musculoskeletal: Negative for muscle cramps and myalgias.       Left leg sore  Gastrointestinal: Negative for abdominal pain, constipation, diarrhea, hematemesis, hematochezia, melena, nausea and vomiting.  Neurological: Negative for dizziness and light-headedness.    PHYSICAL EXAM: Vitals with BMI 01/07/2020 01/07/2020 12/17/2019  Height 5\' 11"  - -  Weight 156 lbs - -  BMI 21.77 - -  Systolic 160 172  Diastolic 80 85 80  Pulse 56 46 60    CONSTITUTIONAL: Well-developed and well-nourished. No acute distress.  SKIN: Skin is warm and dry. No rash noted. No cyanosis. No pallor. No jaundice HEAD: Normocephalic and atraumatic.  EYES: No scleral icterus MOUTH/THROAT: Moist oral membranes.  NECK: No JVD present. No thyromegaly noted. No carotid bruits   LYMPHATIC: No visible cervical adenopathy.  CHEST Normal respiratory effort. No intercostal retractions  LUNGS: Clear to auscultation bilaterally.  No stridor. No wheezes. No rales.  CARDIOVASCULAR: Irregularly irregular, bradycardic, variable S1 and S2, ejection murmur heard at the second right intercostal space, systolic murmur heard at the apex, no gallops or rubs. ABDOMINAL: Nonobese, soft, nontender, nondistended, positive bowel sounds in all 4 quadrants, no apparent ascites.    EXTREMITIES: No peripheral edema  HEMATOLOGIC: No significant bruising NEUROLOGIC: Oriented to person, place, and time. Nonfocal. Normal muscle tone.  PSYCHIATRIC: Normal mood and affect. Normal behavior. Cooperative  CARDIAC DATABASE: EKG: 12/17/2019: Atrial fibrillation, 58 bpm, LVH per voltage criteria, ST-T changes most likely secondary to underlying LVH.  Echocardiogram: 11/27/2019: LVEF 67%, unable to evaluate diastolic function secondary to atrial fibrillation, normal wall motion, mild LVH, mild aortic stenosis, mild AR, moderate MR moderate TR, mild PR, RVSP 31 mmHg.  Heart Catheterization: 05/14/2000: INDICATIONS:  Mr. Gurganus is a 86 year old gentleman with a history of chest pain.  He underwent heart catheterization back in July and was found to have a tight stenosis and a moderate sized diagonal branch.  He was treated medically but continued to have chest pain even despite cardiac rehabilitation.  He was referred for repeat heart catheterization and possible rotational atherectomy. Conclusions: 1. Successful rotational atherectomy and percutaneous transluminal coronary  angioplasty of the first diagonal vessel. 2. Mild to moderate irregularities involving the other vessels. 3. Normal left ventricular systolic function.  LABORATORY DATA:  External Labs:  11/24/2019: Creatinine 1.4 mg/dL. GFR 44  AST 81 ALT 38 Hemoglobin 15.4 g/dL TSH 11/26/2019. Hemoglobin A1c 6.8  IMPRESSION:    ICD-10-CM   1. Persistent atrial fibrillation (HCC)  I48.19   2. Abnormal ankle brachial index (ABI)  R68.89 PCV LOWER ARTERIAL (BILATERAL)  3. Essential hypertension  I10   4. Non-insulin dependent type 2 diabetes  mellitus (HCC)  E11.9   5. Atherosclerosis of native coronary artery of native heart without angina pectoris  I25.10   6. History of angioplasty  Z98.62      RECOMMENDATIONS: Rafael Salway is a 86 y.o. male whose past medical history and cardiac risk factors include: History of  coronary artery disease status post arthrectomy and angioplasty, hypertension, non-insulin-dependent diabetes mellitus type 2, advanced age, mild aortic stenosis and moderate MR/TR.  Persistent atrial fibrillation:  Currently rate controlled and not on AV nodal blocking agents.  Rhythm control: N/A.  Thromboembolic prophylaxis: None.   CHA2DS2-VASc SCORE is 5 which correlates to 6.7% risk of stroke per year (hypertension, age, diabetes, established CAD).  This office visit was to rediscuss their questions and concerns for oral anticoagulation.  Both the patient and her daughter's questions and concerns were addressed.  They are still reluctant to initiate oral anticoagulation at today's office visit.  Patient is physically active and independent but does have underlying dementia and its difficult for the family to decide if the patient should or should not be on oral anticoagulation.  I have also discussed the alternatives to oral anticoagulation to help reduce his thromboembolic risk by being evaluated for watchman device.  Patient's daughter would like to read about this more in detail prior to being considered.  She will call the office back for referral if they are interested in regards to being evaluated for watchman device given his CHA2DS2-VASc score.  For now continue aspirin.  For now reiterated the importance of being cognizant for signs of stroke and to seek medical attention at the closest ER via EMS if such symptoms arise.  Given the patient's history of CAD and newly discovered atrial fibrillation we also discussed undergoing an ischemic evaluation.  At this time patient does not want to proceed with stress test as he remains asymptomatic.  Abnormal ABI:  Given his history of diabetes patient follows up with podiatry.  He was noted to have a sore in the left lower extremity and ankle-brachial indexes were performed at his podiatrist's office.  Patient's daughter states that  the ABIs were abnormal and he is recommended to undergo ultrasound of the lower extremities to evaluate for peripheral vascular disease.  Check lower extremity arterial duplex.  We will forward a copy of the results to his podiatrist.  Benign essential hypertension:  Patient systolic blood pressures are still not well controlled.  But improving.  Currently managed per primary team.  Despite educating them on the importance of checking blood pressures at home they have been unable to do this since last office visit.  This was really encouraged at today's office visit.  We will continue the antihypertensive medications as originally prescribed and will follow up with her primary care provider for further medication titration.  Established coronary artery disease with prior rotational atherectomy and angioplasty.  Currently asymptomatic.  Recommend aspirin 81 mg p.o. daily.  Given the newly discovered atrial fibrillation and known history of CAD recommended ischemic evaluation.  However patient would like to hold off on this at the current time.  Non-insulin-dependent diabetes mellitus type 2: Currently managed by primary team.  FINAL MEDICATION LIST END OF ENCOUNTER: No orders of the defined types were placed in this encounter.   There are no discontinued medications.   Current Outpatient Medications:  .  acetaminophen (TYLENOL) 325 MG tablet, Take 650 mg by mouth every 6 (six) hours as needed., Disp: , Rfl:  .  amLODipine (NORVASC) 5 MG  tablet, Take 1 tablet (5 mg total) by mouth in the morning., Disp: 30 tablet, Rfl: 0 .  aspirin EC 81 MG tablet, Take 81 mg by mouth daily., Disp: , Rfl:  .  donepezil (ARICEPT) 5 MG tablet, Take 1 tablet by mouth daily. hasnt started yet, Disp: , Rfl:  .  doxycycline (VIBRA-TABS) 100 MG tablet, Take 1 tablet (100 mg total) by mouth 2 (two) times daily., Disp: 20 tablet, Rfl: 0 .  mupirocin ointment (BACTROBAN) 2 %, Apply 1 application topically 2  (two) times daily., Disp: 30 g, Rfl: 2  Orders Placed This Encounter  Procedures  . PCV LOWER ARTERIAL (BILATERAL)   --Continue cardiac medications as reconciled in final medication list. --Return in about 6 weeks (around 02/18/2020) for afib follow up.. Or sooner if needed. --Continue follow-up with your primary care physician regarding the management of your other chronic comorbid conditions.  Patient's questions and concerns were addressed to his satisfaction. He voices understanding of the instructions provided during this encounter.   This note was created using a voice recognition software as a result there may be grammatical errors inadvertently enclosed that do not reflect the nature of this encounter. Every attempt is made to correct such errors.  Tessa Lerner, [place], [facility]  Pager: [phone] Office: [phone]

## 2020-01-08 ENCOUNTER — Telehealth: Payer: Self-pay | Admitting: *Deleted

## 2020-01-08 DIAGNOSIS — L03116 Cellulitis of left lower limb: Secondary | ICD-10-CM

## 2020-01-08 DIAGNOSIS — L97521 Non-pressure chronic ulcer of other part of left foot limited to breakdown of skin: Secondary | ICD-10-CM

## 2020-01-08 DIAGNOSIS — I739 Peripheral vascular disease, unspecified: Secondary | ICD-10-CM

## 2020-01-08 NOTE — Telephone Encounter (Signed)
-----   Message from Vivi Barrack, DPM sent at 01/07/2020 10:00 AM EDT ----- Can you please order arterial duplex for [facility] (Dr. Jodi Marble office). Thanks.

## 2020-01-08 NOTE — Progress Notes (Signed)
Subjective:   Patient ID: Peter Martin, male   DOB: 86 y.o.   MRN: [id number]   HPI 86 year old male presents the office today with his main concern of a wound to the top of his left foot.  He developed a blister about 1 week ago.  Shoes without socks on a hot day and afterwards noticed a blister.  They recently started noticed some redness around the area and there was some drainage yesterday, possible purulence.  He has no pain associated with it.  They have been keeping a small amount of antibiotic ointment on the wound daily.  Also asking for his nails be trimmed and has fungus on his right toenail.  The nails are thick causing discomfort.  He is diabetic and last A1c was 6.8.  Last glucose was 177 he reports.   Review of Systems  All other systems reviewed and are negative.  Past Medical History:  Diagnosis Date  . Atrial fibrillation (HCC)   . Coronary artery disease   . Dementia (HCC)   . Diabetes mellitus without complication (HCC)   . Hypertension     Past Surgical History:  Procedure Laterality Date  . CORONARY ANGIOPLASTY       Current Outpatient Medications:  .  acetaminophen (TYLENOL) 325 MG tablet, Take 650 mg by mouth every 6 (six) hours as needed., Disp: , Rfl:  .  amLODipine (NORVASC) 5 MG tablet, Take 1 tablet (5 mg total) by mouth in the morning., Disp: 30 tablet, Rfl: 0 .  aspirin EC 81 MG tablet, Take 81 mg by mouth daily., Disp: , Rfl:  .  donepezil (ARICEPT) 5 MG tablet, Take 1 tablet by mouth daily. hasnt started yet, Disp: , Rfl:  .  doxycycline (VIBRA-TABS) 100 MG tablet, Take 1 tablet (100 mg total) by mouth 2 (two) times daily., Disp: 20 tablet, Rfl: 0 .  mupirocin ointment (BACTROBAN) 2 %, Apply 1 application topically 2 (two) times daily., Disp: 30 g, Rfl: 2  No Known Allergies       Objective:  Physical Exam  General: AAO x3, NAD  Dermatological: On the dorsal aspect of the left midfoot there is a wound measuring proximally 2 x 1 cm and  there is mild surrounding erythema but there is no ascending cellulitis.  Edema to the foot without any significant warmth.  There is no drainage or pus there is no fluctuation crepitation identified today.  The nails appear to be hypertrophic, dystrophic and discolored with yellow-brown discoloration particular right hallux toenail is the worst.  There is no other open lesions.  Vascular: Dorsalis Pedis artery and Posterior Tibial artery pedal pulses are decreased bilateral There is no pain with calf compression, swelling, warmth, erythema.   Neruologic: Mild decreased with Semmes Weinstein monofilament  Musculoskeletal: No gross boney pedal deformities bilateral. No pain, crepitus, or limitation noted with foot and ankle range of motion bilateral. Muscular strength 5/5 in all groups tested bilateral.     Assessment:   86 year old male left foot wound with localized cellulitis, concern for PAD; symptomatic onychomycosis     Plan:  -Treatment options discussed including all alternatives, risks, and complications -Etiology of symptoms were discussed -Regards to the prescribed doxycycline.  Mupirocin ointment dressing changes daily which was also prescribed.  I did an ABI in the office today which was read as "PAD" previously ordered arterial studies.  -Nails sharply debrided x10 without any complications or bleeding  Return in about 2 weeks (around 01/21/2020).  Vivi Barrack  DPM

## 2020-01-08 NOTE — Telephone Encounter (Signed)
Orders faxed to [facility].

## 2020-01-15 ENCOUNTER — Other Ambulatory Visit: Payer: Medicare Other

## 2020-01-21 ENCOUNTER — Telehealth: Payer: Self-pay | Admitting: Podiatry

## 2020-01-21 NOTE — Telephone Encounter (Signed)
Pt daughter called and would like more information on the  Arterial ultra sound on legs and wanted to know if this was gonna be helpful please advise the appt is tomorrow

## 2020-01-22 ENCOUNTER — Telehealth: Payer: Self-pay | Admitting: *Deleted

## 2020-01-22 ENCOUNTER — Other Ambulatory Visit: Payer: Medicare Other

## 2020-01-22 NOTE — Telephone Encounter (Signed)
Called and spoke with Gavin Pound the daughter and the daughter stated that the patient did not get the test done today and that there was a mix up with the schedule and it will be done next Friday 01-29-2020 and the daughter stated that the wound is pretty much healed up and that they will see Dr Ardelle Anton Monday. Misty Stanley

## 2020-01-22 NOTE — Telephone Encounter (Signed)
Peter Martin- did you call them yesterday by change? It is to evaluate the circulation to make sure there is enough blood flow to the feet.

## 2020-01-25 ENCOUNTER — Ambulatory Visit (INDEPENDENT_AMBULATORY_CARE_PROVIDER_SITE_OTHER): Payer: Medicare Other | Admitting: Podiatry

## 2020-01-25 ENCOUNTER — Other Ambulatory Visit: Payer: Self-pay

## 2020-01-25 DIAGNOSIS — I2510 Atherosclerotic heart disease of native coronary artery without angina pectoris: Secondary | ICD-10-CM | POA: Diagnosis not present

## 2020-01-25 DIAGNOSIS — L97521 Non-pressure chronic ulcer of other part of left foot limited to breakdown of skin: Secondary | ICD-10-CM

## 2020-01-25 DIAGNOSIS — I739 Peripheral vascular disease, unspecified: Secondary | ICD-10-CM

## 2020-01-29 ENCOUNTER — Other Ambulatory Visit: Payer: Medicare Other

## 2020-01-31 NOTE — Progress Notes (Signed)
Subjective: 86 year old male presents the office today for follow-up evaluation of wound on the left foot.  He presents today with his daughter.  It appears that the wound is completely resolved and he is having no issues.  They are asked if he still she get the circulation test he is is difficult to get him to the appointments. Denies any systemic complaints such as fevers, chills, nausea, vomiting. No acute changes since last appointment, and no other complaints at this time.   Objective: NAD DP/PT pulses decreased bilaterally The wound that was present on the left foot has completely healed.  There is minimal swelling there is no erythema or warmth.  There is no open lesions identified bilaterally.  No areas of discomfort or fluctuation. No open lesions or pre-ulcerative lesions.  No pain with calf compression, swelling, warmth, erythema  Assessment: Healed ulceration left foot; PAD  Plan: -All treatment options discussed with the patient including all alternatives, risks, complications.  -At this time it appears the wound is healed.  At this time as there is no open lesions and is having no symptoms will hold off on circulation test I think it will be okay but should the wound recur will need to have this done. -Patient encouraged to call the office with any questions, concerns, change in symptoms.   Vivi Barrack DPM

## 2020-02-18 ENCOUNTER — Ambulatory Visit: Payer: Medicare Other | Admitting: Cardiology

## 2020-02-23 DIAGNOSIS — I4891 Unspecified atrial fibrillation: Secondary | ICD-10-CM | POA: Diagnosis not present

## 2020-02-23 DIAGNOSIS — N183 Chronic kidney disease, stage 3 unspecified: Secondary | ICD-10-CM | POA: Diagnosis not present

## 2020-02-23 DIAGNOSIS — E119 Type 2 diabetes mellitus without complications: Secondary | ICD-10-CM | POA: Diagnosis not present

## 2020-03-31 DIAGNOSIS — L989 Disorder of the skin and subcutaneous tissue, unspecified: Secondary | ICD-10-CM | POA: Diagnosis not present

## 2020-03-31 DIAGNOSIS — I4891 Unspecified atrial fibrillation: Secondary | ICD-10-CM | POA: Diagnosis not present

## 2020-03-31 DIAGNOSIS — R079 Chest pain, unspecified: Secondary | ICD-10-CM | POA: Diagnosis not present

## 2020-03-31 DIAGNOSIS — D045 Carcinoma in situ of skin of trunk: Secondary | ICD-10-CM | POA: Diagnosis not present

## 2020-04-15 DIAGNOSIS — I4891 Unspecified atrial fibrillation: Secondary | ICD-10-CM | POA: Diagnosis not present

## 2020-04-20 ENCOUNTER — Ambulatory Visit: Payer: Medicare Other | Admitting: Podiatry

## 2020-05-11 ENCOUNTER — Encounter: Payer: Self-pay | Admitting: Podiatry

## 2020-05-11 ENCOUNTER — Ambulatory Visit (INDEPENDENT_AMBULATORY_CARE_PROVIDER_SITE_OTHER): Payer: Medicare Other | Admitting: Podiatry

## 2020-05-11 ENCOUNTER — Other Ambulatory Visit: Payer: Self-pay

## 2020-05-11 DIAGNOSIS — M79674 Pain in right toe(s): Secondary | ICD-10-CM

## 2020-05-11 DIAGNOSIS — B351 Tinea unguium: Secondary | ICD-10-CM

## 2020-05-11 DIAGNOSIS — M79675 Pain in left toe(s): Secondary | ICD-10-CM | POA: Diagnosis not present

## 2020-05-11 DIAGNOSIS — I4891 Unspecified atrial fibrillation: Secondary | ICD-10-CM | POA: Insufficient documentation

## 2020-05-11 DIAGNOSIS — Q278 Other specified congenital malformations of peripheral vascular system: Secondary | ICD-10-CM | POA: Insufficient documentation

## 2020-05-11 DIAGNOSIS — I739 Peripheral vascular disease, unspecified: Secondary | ICD-10-CM

## 2020-05-15 NOTE — Progress Notes (Signed)
  Subjective:  Patient ID: Peter Martin, male    DOB: 26-Jul-1930,  MRN: [id number]  86 y.o. male presents with painful thick toenails that are difficult to trim. Pain interferes with ambulation. Aggravating factors include wearing enclosed shoe gear. Pain is relieved with periodic professional debridement.   Daughter is present during today's visit. She states her father had a wound on the top of left foot which has healed.  They voice no new pedal concerns on today's visit.  Review of Systems: Negative except as noted in the HPI.  Past Medical History:  Diagnosis Date  . Atrial fibrillation (HCC)   . Coronary artery disease   . Dementia (HCC)   . Diabetes mellitus without complication (HCC)   . Hypertension    Past Surgical History:  Procedure Laterality Date  . CORONARY ANGIOPLASTY     Patient Active Problem List   Diagnosis Date Noted  . A-fib (HCC) 05/11/2020  . Angioectopia 05/11/2020    Current Outpatient Medications:  .  acetaminophen (TYLENOL) 325 MG tablet, Take 650 mg by mouth every 6 (six) hours as needed., Disp: , Rfl:  .  donepezil (ARICEPT) 10 MG tablet, Take 10 mg by mouth at bedtime., Disp: , Rfl:  .  doxycycline (VIBRA-TABS) 100 MG tablet, Take 1 tablet (100 mg total) by mouth 2 (two) times daily., Disp: 20 tablet, Rfl: 0 .  lisinopril (ZESTRIL) 10 MG tablet, Take 10 mg by mouth at bedtime., Disp: , Rfl:  No Known Allergies Social History   Occupational History  . Not on file  Tobacco Use  . Smoking status: Never Smoker  . Smokeless tobacco: Never Used  Substance and Sexual Activity  . Alcohol use: Never  . Drug use: Never  . Sexual activity: Not on file    Objective:   Constitutional Pt is a pleasant 86 y.o. Caucasian male in NAD. AAO x 3.   Vascular Capillary fill time to digits <3 seconds b/l lower extremities. Faintly palpable pedal pulses b/l. Pedal hair absent. Lower extremity skin temperature gradient within normal limits. No pain with calf  compression b/l. No edema noted b/l lower extremities. No cyanosis or clubbing noted.  Neurologic Normal speech. Oriented to person, place, and time. Protective sensation intact 5/5 intact bilaterally with 10g monofilament b/l.  Dermatologic Pedal skin is thin shiny, atrophic b/l lower extremities. No open wounds bilaterally. No interdigital macerations bilaterally. Toenails 1-5 b/l elongated, discolored, dystrophic, thickened, crumbly with subungual debris and tenderness to dorsal palpation.  Orthopedic: Normal muscle strength 5/5 to all lower extremity muscle groups bilaterally. No pain crepitus or joint limitation noted with ROM b/l. Hallux valgus with bunion deformity noted b/l lower extremities.   Radiographs: None Assessment:   1. Pain due to onychomycosis of toenails of both feet   2. PAD (peripheral artery disease) (HCC)    Plan:  Patient was evaluated and treated and all questions answered.  Onychomycosis with pain -Nails palliatively debridement as below. -Educated on self-care  Procedure: Nail Debridement Rationale: Pain Type of Debridement: manual, sharp debridement. Instrumentation: Nail nipper, rotary burr. Number of Nails: 10  -Examined patient. -No new findings. No new orders. -Toenails 1-5 b/l were debrided in length and girth with sterile nail nippers and dremel without iatrogenic bleeding.  -Patient to report any pedal injuries to medical professional immediately. -Patient to continue soft, supportive shoe gear daily. -Patient/POA to call should there be question/concern in the interim.  Return in about 3 months (around 08/11/2020).  Freddie Breech, DPM

## 2020-05-25 DIAGNOSIS — R739 Hyperglycemia, unspecified: Secondary | ICD-10-CM | POA: Diagnosis not present

## 2020-05-25 DIAGNOSIS — I10 Essential (primary) hypertension: Secondary | ICD-10-CM | POA: Diagnosis not present

## 2020-05-25 DIAGNOSIS — R413 Other amnesia: Secondary | ICD-10-CM | POA: Diagnosis not present

## 2020-05-25 DIAGNOSIS — Z23 Encounter for immunization: Secondary | ICD-10-CM | POA: Diagnosis not present

## 2020-05-25 DIAGNOSIS — N189 Chronic kidney disease, unspecified: Secondary | ICD-10-CM | POA: Diagnosis not present

## 2020-05-25 DIAGNOSIS — R001 Bradycardia, unspecified: Secondary | ICD-10-CM | POA: Diagnosis not present

## 2020-05-30 DIAGNOSIS — E119 Type 2 diabetes mellitus without complications: Secondary | ICD-10-CM | POA: Diagnosis not present

## 2020-05-30 DIAGNOSIS — H43813 Vitreous degeneration, bilateral: Secondary | ICD-10-CM | POA: Diagnosis not present

## 2020-05-30 DIAGNOSIS — H40023 Open angle with borderline findings, high risk, bilateral: Secondary | ICD-10-CM | POA: Diagnosis not present

## 2020-05-30 DIAGNOSIS — H25813 Combined forms of age-related cataract, bilateral: Secondary | ICD-10-CM | POA: Diagnosis not present

## 2020-06-01 DIAGNOSIS — Z23 Encounter for immunization: Secondary | ICD-10-CM | POA: Diagnosis not present

## 2020-08-15 ENCOUNTER — Ambulatory Visit: Payer: Medicare Other | Admitting: Podiatry

## 2020-09-13 ENCOUNTER — Other Ambulatory Visit: Payer: Self-pay

## 2020-09-13 ENCOUNTER — Ambulatory Visit (INDEPENDENT_AMBULATORY_CARE_PROVIDER_SITE_OTHER): Payer: Medicare Other | Admitting: Podiatry

## 2020-09-13 ENCOUNTER — Encounter: Payer: Self-pay | Admitting: Podiatry

## 2020-09-13 DIAGNOSIS — B351 Tinea unguium: Secondary | ICD-10-CM

## 2020-09-13 DIAGNOSIS — M79675 Pain in left toe(s): Secondary | ICD-10-CM

## 2020-09-13 DIAGNOSIS — M79674 Pain in right toe(s): Secondary | ICD-10-CM

## 2020-09-13 DIAGNOSIS — E1151 Type 2 diabetes mellitus with diabetic peripheral angiopathy without gangrene: Secondary | ICD-10-CM

## 2020-09-18 NOTE — Progress Notes (Signed)
Subjective:  Patient ID: Peter Martin, male    DOB: 1930/07/23,  MRN: [id number]  87 y.o. male presents with at risk foot care. Pt has h/o NIDDM with PAD and painful thick toenails that are difficult to trim. Pain interferes with ambulation. Aggravating factors include wearing enclosed shoe gear. Pain is relieved with periodic professional debridement.   PCP is Dr. Windle Guard. Last visit was 05/25/2020.  Review of Systems: Negative except as noted in the HPI.  Past Medical History:  Diagnosis Date  . Atrial fibrillation (HCC)   . Coronary artery disease   . Dementia (HCC)   . Diabetes mellitus without complication (HCC)   . Hypertension    Past Surgical History:  Procedure Laterality Date  . CORONARY ANGIOPLASTY     Patient Active Problem List   Diagnosis Date Noted  . A-fib (HCC) 05/11/2020  . Angioectopia 05/11/2020    Current Outpatient Medications:  .  acetaminophen (TYLENOL) 325 MG tablet, Take 650 mg by mouth every 6 (six) hours as needed., Disp: , Rfl:  .  amLODipine (NORVASC) 5 MG tablet, TAKE ONE TABLET BY MOUTH AT BEDTIME FOR HYPERTENSION., Disp: , Rfl:  .  donepezil (ARICEPT) 10 MG tablet, Take 10 mg by mouth at bedtime., Disp: , Rfl:  .  doxycycline (VIBRA-TABS) 100 MG tablet, Take 1 tablet (100 mg total) by mouth 2 (two) times daily., Disp: 20 tablet, Rfl: 0 .  hydrocortisone (CORTEF) 20 MG tablet, Take by mouth., Disp: , Rfl:  .  lisinopril (ZESTRIL) 10 MG tablet, Take 10 mg by mouth at bedtime., Disp: , Rfl:  .  lisinopril (ZESTRIL) 40 MG tablet, Take 40 mg by mouth at bedtime., Disp: , Rfl:  No Known Allergies Social History   Occupational History  . Not on file  Tobacco Use  . Smoking status: Never Smoker  . Smokeless tobacco: Never Used  Substance and Sexual Activity  . Alcohol use: Never  . Drug use: Never  . Sexual activity: Not on file    Objective:   Constitutional Pt is a pleasant 87 y.o. Caucasian male in NAD. AAO x 3.   Vascular Capillary  fill time to digits <3 seconds b/l lower extremities. Faintly palpable pedal pulses b/l. Pedal hair absent. Lower extremity skin temperature gradient within normal limits. No pain with calf compression b/l. No edema noted b/l lower extremities. No cyanosis or clubbing noted.  Neurologic Normal speech. Oriented to person, place, and time. Protective sensation intact 5/5 intact bilaterally with 10g monofilament b/l.  Dermatologic Pedal skin is thin shiny, atrophic b/l lower extremities. No open wounds bilaterally. No interdigital macerations bilaterally. Toenails 1-5 b/l elongated, discolored, dystrophic, thickened, crumbly with subungual debris and tenderness to dorsal palpation.  Orthopedic: Normal muscle strength 5/5 to all lower extremity muscle groups bilaterally. No pain crepitus or joint limitation noted with ROM b/l. Hallux valgus with bunion deformity noted b/l lower extremities.   Radiographs: None Assessment:   1. Pain due to onychomycosis of toenails of both feet   2. Type II diabetes mellitus with peripheral angiopathy (HCC)    Plan:  Patient was evaluated and treated and all questions answered.  Onychomycosis with pain -Nails palliatively debridement as below. -Educated on self-care  Procedure: Nail Debridement Rationale: Pain Type of Debridement: manual, sharp debridement. Instrumentation: Nail nipper, rotary burr. Number of Nails: 10  -Examined patient. -No new findings. No new orders. -Toenails 1-5 b/l were debrided in length and girth with sterile nail nippers and dremel without iatrogenic bleeding.  -  Patient to report any pedal injuries to medical professional immediately. -Patient to continue soft, supportive shoe gear daily. -Patient/POA to call should there be question/concern in the interim.  Return in about 3 months (around 12/11/2020).  Freddie Breech, DPM

## 2020-09-28 ENCOUNTER — Other Ambulatory Visit: Payer: Self-pay | Admitting: Nephrology

## 2020-09-28 DIAGNOSIS — N183 Chronic kidney disease, stage 3 unspecified: Secondary | ICD-10-CM

## 2020-11-15 ENCOUNTER — Other Ambulatory Visit: Payer: Self-pay | Admitting: Family Medicine

## 2020-11-15 ENCOUNTER — Other Ambulatory Visit: Payer: Self-pay

## 2020-11-15 DIAGNOSIS — R1310 Dysphagia, unspecified: Secondary | ICD-10-CM

## 2020-11-22 ENCOUNTER — Encounter (INDEPENDENT_AMBULATORY_CARE_PROVIDER_SITE_OTHER): Payer: Self-pay

## 2020-11-22 ENCOUNTER — Ambulatory Visit
Admission: RE | Admit: 2020-11-22 | Discharge: 2020-11-22 | Disposition: A | Discharge status: Home / Self Care | Payer: Medicare Other | Source: Ambulatory Visit | Attending: Nephrology | Admitting: Nephrology

## 2020-11-22 DIAGNOSIS — N183 Chronic kidney disease, stage 3 unspecified: Secondary | ICD-10-CM

## 2020-12-07 ENCOUNTER — Other Ambulatory Visit: Payer: Self-pay | Admitting: Family Medicine

## 2020-12-07 ENCOUNTER — Ambulatory Visit
Admission: RE | Admit: 2020-12-07 | Discharge: 2020-12-07 | Disposition: A | Discharge status: Home / Self Care | Payer: Medicare Other | Source: Ambulatory Visit | Attending: Family Medicine | Admitting: Family Medicine

## 2020-12-07 DIAGNOSIS — R1310 Dysphagia, unspecified: Secondary | ICD-10-CM

## 2020-12-27 ENCOUNTER — Ambulatory Visit: Payer: Medicare Other | Admitting: Podiatry

## 2021-01-02 ENCOUNTER — Emergency Department
Admission: EM | Admit: 2021-01-02 | Discharge: 2021-01-02 | Disposition: A | Discharge status: Home / Self Care | Payer: Medicare Other | Attending: Emergency Medicine | Admitting: Emergency Medicine

## 2021-01-02 ENCOUNTER — Other Ambulatory Visit: Payer: Self-pay

## 2021-01-02 DIAGNOSIS — F0390 Unspecified dementia without behavioral disturbance: Secondary | ICD-10-CM | POA: Diagnosis not present

## 2021-01-02 DIAGNOSIS — L0591 Pilonidal cyst without abscess: Secondary | ICD-10-CM | POA: Diagnosis not present

## 2021-01-02 DIAGNOSIS — Z4803 Encounter for change or removal of drains: Secondary | ICD-10-CM | POA: Insufficient documentation

## 2021-01-02 DIAGNOSIS — I10 Essential (primary) hypertension: Secondary | ICD-10-CM | POA: Diagnosis not present

## 2021-01-02 DIAGNOSIS — I2510 Atherosclerotic heart disease of native coronary artery without angina pectoris: Secondary | ICD-10-CM | POA: Insufficient documentation

## 2021-01-02 DIAGNOSIS — Z79899 Other long term (current) drug therapy: Secondary | ICD-10-CM | POA: Insufficient documentation

## 2021-01-02 DIAGNOSIS — L989 Disorder of the skin and subcutaneous tissue, unspecified: Secondary | ICD-10-CM | POA: Diagnosis present

## 2021-01-02 DIAGNOSIS — E119 Type 2 diabetes mellitus without complications: Secondary | ICD-10-CM | POA: Insufficient documentation

## 2021-01-02 MED ORDER — DOXYCYCLINE HYCLATE 100 MG PO CAPS: 100.0000 mg | ORAL_CAPSULE | Freq: Two times a day (BID) | ORAL | 0 refills | Status: DC

## 2021-01-02 NOTE — ED Triage Notes (Signed)
Pt come with c/o lower back wound. Family reports it showed up yesterday evening. Wound popped and drainage present.  Bandage in place at this time.   Pt has caregiver who has addressed this wound and advised family to come here

## 2021-01-02 NOTE — ED Provider Notes (Signed)
[facility] Emergency Department Provider Note  ____________________________________________  Time seen: Approximately 3:38 PM  I have reviewed the triage vital signs and the nursing notes.   HISTORY  Chief Complaint Wound Infection   HPI Peter Martin is a 87 y.o. male with a history of A. fib, coronary artery disease, diabetes, and A. fib as well as history as listed below presents to the emergency department for treatment and evaluation of a draining wound to his lower back.  Per the daughter, he has 24-hour healthcare assistance and one of them noticed the area last night.  She started applying warm compresses off-and-on throughout the night and this morning states that the area ruptured and drained a significant amount of purulent fluid.  This happened a second time before he deciding to come to the emergency department.  No known fever or history of the same.   Past Medical History:  Diagnosis Date  . Atrial fibrillation (HCC)   . Coronary artery disease   . Dementia (HCC)   . Diabetes mellitus without complication (HCC)   . Hypertension     Patient Active Problem List   Diagnosis Date Noted  . A-fib (HCC) 05/11/2020  . Angioectopia 05/11/2020    Past Surgical History:  Procedure Laterality Date  . CORONARY ANGIOPLASTY      Prior to Admission medications   Medication Sig Start Date End Date Taking? Authorizing Provider  acetaminophen (TYLENOL) 325 MG tablet Take 650 mg by mouth every 6 (six) hours as needed.    [provider]  amLODipine (NORVASC) 5 MG tablet TAKE ONE TABLET BY MOUTH AT BEDTIME FOR HYPERTENSION. 08/23/20   [provider]  donepezil (ARICEPT) 10 MG tablet Take 10 mg by mouth at bedtime. 04/13/20   [provider]  doxycycline (VIBRAMYCIN) 100 MG capsule Take 1 capsule (100 mg total) by mouth 2 (two) times daily. 01/02/21   Jazzmine Kleiman, Rulon Eisenmenger B, FNP  hydrocortisone (CORTEF) 20 MG tablet Take by mouth. 05/17/20    [provider]  lisinopril (ZESTRIL) 10 MG tablet Take 10 mg by mouth at bedtime. 04/15/20   [provider]  lisinopril (ZESTRIL) 40 MG tablet Take 40 mg by mouth at bedtime. 07/14/20   [provider]    Allergies Patient has no known allergies.  No family history on file.  Social History Social History   Tobacco Use  . Smoking status: Never Smoker  . Smokeless tobacco: Never Used  Substance Use Topics  . Alcohol use: Never  . Drug use: Never    Review of Systems  Constitutional: Negative for fever. Respiratory: Negative for cough or shortness of breath.  Musculoskeletal: Negative for myalgias Skin: Positive for lesion to lower back. Neurological: Negative for numbness or paresthesias. ____________________________________________   PHYSICAL EXAM:  VITAL SIGNS: ED Triage Vitals  Enc Vitals Group     BP 01/02/21 1233 (!) 164/68     Pulse Rate 01/02/21 1233 (!) 43     Resp 01/02/21 1233 18     Temp 01/02/21 1233 97.6 F (36.4 C)     Temp src --      SpO2 01/02/21 1233 100 %     Weight --      Height --      Head Circumference --      Peak Flow --      Pain Score 01/02/21 1232 4     Pain Loc --      Pain Edu? --  Excl. in GC? --      Constitutional: Overall well appearing. Eyes: Conjunctivae are clear without discharge or drainage. Nose: No rhinorrhea noted. Mouth/Throat: Airway is patent.  Neck: No stridor. Unrestricted range of motion observed. Cardiovascular: Capillary refill is <3 seconds.  Respiratory: Respirations are even and unlabored.. Musculoskeletal: Unrestricted range of motion observed. Neurologic: Awake, alert, and oriented x 4.  Skin: Draining pilonidal abscess at the natal cleft with surrounding erythema and induration.  ____________________________________________   LABS (all labs ordered are listed, but only abnormal results are displayed)  Labs Reviewed - No data to  display ____________________________________________  EKG  Not indicated. ____________________________________________  RADIOLOGY  Not indicated ____________________________________________   PROCEDURES  Procedures ____________________________________________   INITIAL IMPRESSION / ASSESSMENT AND PLAN / ED COURSE  Peter Martin is a 87 y.o. male presenting to the emergency department for treatment and evaluation of a skin lesion to the lower back that was discovered by his healthcare attendant yesterday.  See HPI for further details.  Area is open and draining spontaneously.  No indication for incision and drainage at this time.  He will be placed on doxycycline and have close follow-up with his primary care provider.  For symptoms that change, worsen, or do not improve they are to return to the emergency department if unable to see primary care.  They were also advised that they may need to follow-up with the surgeon if the area returns in the future.   Medications - No data to display   Pertinent labs & imaging results that were available during my care of the patient were reviewed by me and considered in my medical decision making (see chart for details).  ____________________________________________   FINAL CLINICAL IMPRESSION(S) / ED DIAGNOSES  Final diagnoses:  Pilonidal cyst of natal cleft    ED Discharge Orders         Ordered    doxycycline (VIBRAMYCIN) 100 MG capsule  2 times daily,   Status:  Discontinued        01/02/21 1303    doxycycline (VIBRAMYCIN) 100 MG capsule  2 times daily,   Status:  Discontinued        01/02/21 1323    doxycycline (VIBRAMYCIN) 100 MG capsule  2 times daily        01/02/21 1407           Note:  This document was prepared using Dragon voice recognition software and may include unintentional dictation errors.   Chinita Pester, FNP 01/02/21 1542    Jene Every, MD 01/03/21 1247

## 2021-01-02 NOTE — Discharge Instructions (Signed)
Continue the warm compresses off and on several times per day.  Take the antibiotic as prescribed and until finished.  See PCP in 3 days if area is still draining, or sooner for symptoms of concern.  Return to the ER for concerns if unable to schedule an appointment.

## 2021-01-11 ENCOUNTER — Other Ambulatory Visit: Payer: Self-pay | Admitting: Gastroenterology

## 2021-01-17 ENCOUNTER — Ambulatory Visit: Payer: Medicare Other | Admitting: Podiatry

## 2021-01-24 ENCOUNTER — Other Ambulatory Visit: Payer: Self-pay | Admitting: Gastroenterology

## 2021-01-25 ENCOUNTER — Other Ambulatory Visit: Payer: Self-pay

## 2021-01-25 ENCOUNTER — Encounter: Payer: Self-pay | Admitting: Podiatry

## 2021-01-25 ENCOUNTER — Ambulatory Visit (INDEPENDENT_AMBULATORY_CARE_PROVIDER_SITE_OTHER): Payer: Medicare Other | Admitting: Podiatry

## 2021-01-25 DIAGNOSIS — I739 Peripheral vascular disease, unspecified: Secondary | ICD-10-CM

## 2021-01-25 DIAGNOSIS — B351 Tinea unguium: Secondary | ICD-10-CM

## 2021-01-25 DIAGNOSIS — M79675 Pain in left toe(s): Secondary | ICD-10-CM

## 2021-01-25 DIAGNOSIS — M79674 Pain in right toe(s): Secondary | ICD-10-CM

## 2021-01-25 DIAGNOSIS — E1151 Type 2 diabetes mellitus with diabetic peripheral angiopathy without gangrene: Secondary | ICD-10-CM

## 2021-01-25 NOTE — Progress Notes (Signed)
This patient returns to my office for at risk foot care.  This patient requires this care by a professional since this patient will be at risk due to having diabetes and PAD.  This patient is unable to cut nails himself since the patient cannot reach his nails.These nails are painful walking and wearing shoes.  This patient presents for at risk foot care today.  General Appearance  Alert, conversant and in no acute stress.  Vascular  Dorsalis pedis and posterior tibial  pulses are  weakly palpable  bilaterally.  Capillary return is within normal limits  bilaterally. Temperature is within normal limits  bilaterally.  Neurologic  Senn-Weinstein monofilament wire test within normal limits  bilaterally. Muscle power within normal limits bilaterally.  Nails Thick disfigured discolored nails with subungual debris  from hallux to fifth toes bilaterally. No evidence of bacterial infection or drainage bilaterally.  Orthopedic  No limitations of motion  feet .  No crepitus or effusions noted.  No bony pathology or digital deformities noted. HAV  B/L.  Skin  normotropic skin with no porokeratosis noted bilaterally.  No signs of infections or ulcers noted.     Onychomycosis  Pain in right toes  Pain in left toes  Consent was obtained for treatment procedures.   Mechanical debridement of nails 1-5  bilaterally performed with a nail nipper.  Filed with dremel without incident.    Return office visit     3 months                 Told patient to return for periodic foot care and evaluation due to potential at risk complications.   Helane Gunther DPM

## 2021-01-27 ENCOUNTER — Other Ambulatory Visit: Payer: Self-pay

## 2021-01-27 ENCOUNTER — Encounter (HOSPITAL_COMMUNITY): Payer: Self-pay | Admitting: Gastroenterology

## 2021-01-30 ENCOUNTER — Ambulatory Visit (HOSPITAL_COMMUNITY): Payer: Medicare Other | Admitting: Anesthesiology

## 2021-01-30 NOTE — Anesthesia Preprocedure Evaluation (Deleted)
Anesthesia Evaluation  Patient identified by MRN, date of birth, ID band Patient awake    Reviewed: Allergy & Precautions, NPO status , Patient's Chart, lab work & pertinent test results  Airway Mallampati: II  TM Distance: >3 FB Neck ROM: Full    Dental no notable dental hx.    Pulmonary neg pulmonary ROS,    Pulmonary exam normal breath sounds clear to auscultation       Cardiovascular hypertension, + CAD  + dysrhythmias Atrial Fibrillation + Valvular Problems/Murmurs MR and AS  Rhythm:Regular Rate:Bradycardia  Left ventricle cavity is mildly dilated. Mild concentric hypertrophy of  the left ventricle. Normal global wall motion. Normal LV systolic function  with EF 67%. Unable to evaluate diastolic function due to atrial  fibrillation. Left atrial pressure is elevated.  Moderate biatrial dilatation.  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 Mild aortic  stenosis. Aortic valve mean gradient of 23 mmHg, Vmax 2.4 m/s. Calculated  aortic valve area by continuity equation is 1.2 cm. Mild (Grade I) aortic  regurgitation.  Moderate (Grade III) mitral regurgitation.  Moderate tricuspid regurgitation. Estimated pulmonary artery systolic  pressure is 31 mmHg.  Mild pulmonic regurgitation.  Compared to previous study in 2018, biatrial dilatation is more prominent.   Imaging Info     Neuro/Psych Dementia negative neurological ROS     GI/Hepatic negative GI ROS, Neg liver ROS,   Endo/Other  negative endocrine ROSdiabetes  Renal/GU negative Renal ROS  negative genitourinary   Musculoskeletal negative musculoskeletal ROS (+)   Abdominal   Peds negative pediatric ROS (+)  Hematology negative hematology ROS (+)   Anesthesia Other Findings   Reproductive/Obstetrics negative OB ROS                            Anesthesia Physical Anesthesia Plan  ASA: 3  Anesthesia Plan: MAC    Post-op Pain Management:    Induction: Intravenous  PONV Risk Score and Plan: 1 and Propofol infusion  Airway Management Planned: Nasal Cannula  Additional Equipment:   Intra-op Plan:   Post-operative Plan:   Informed Consent: I have reviewed the patients History and Physical, chart, labs and discussed the procedure including the risks, benefits and alternatives for the proposed anesthesia with the patient or authorized representative who has indicated his/her understanding and acceptance.     Dental advisory given  Plan Discussed with: CRNA and Surgeon  Anesthesia Plan Comments:         Anesthesia Quick Evaluation

## 2021-01-31 ENCOUNTER — Ambulatory Visit (HOSPITAL_COMMUNITY)
Admission: RE | Admit: 2021-01-31 | Discharge: 2021-01-31 | Disposition: A | Discharge status: Home / Self Care | Payer: Medicare Other | Attending: Gastroenterology | Admitting: Gastroenterology

## 2021-01-31 ENCOUNTER — Encounter (HOSPITAL_COMMUNITY): Payer: Self-pay | Admitting: Gastroenterology

## 2021-01-31 ENCOUNTER — Encounter (HOSPITAL_COMMUNITY)
Admission: RE | Disposition: A | Discharge status: Home / Self Care | Payer: Self-pay | Source: Home / Self Care | Attending: Gastroenterology

## 2021-01-31 ENCOUNTER — Ambulatory Visit: Payer: Medicare Other | Admitting: Student

## 2021-01-31 ENCOUNTER — Encounter: Payer: Self-pay | Admitting: Student

## 2021-01-31 ENCOUNTER — Other Ambulatory Visit: Payer: Self-pay

## 2021-01-31 VITALS — BP 163/67 | HR 43 | Temp 98.0°F | Resp 17 | Ht 69.0 in | Wt 152.0 lb

## 2021-01-31 DIAGNOSIS — Z79899 Other long term (current) drug therapy: Secondary | ICD-10-CM | POA: Diagnosis not present

## 2021-01-31 DIAGNOSIS — K222 Esophageal obstruction: Secondary | ICD-10-CM | POA: Insufficient documentation

## 2021-01-31 DIAGNOSIS — I10 Essential (primary) hypertension: Secondary | ICD-10-CM

## 2021-01-31 DIAGNOSIS — Z792 Long term (current) use of antibiotics: Secondary | ICD-10-CM | POA: Insufficient documentation

## 2021-01-31 DIAGNOSIS — I4819 Other persistent atrial fibrillation: Secondary | ICD-10-CM

## 2021-01-31 DIAGNOSIS — R001 Bradycardia, unspecified: Secondary | ICD-10-CM

## 2021-01-31 LAB — GLUCOSE, CAPILLARY: Glucose-Capillary: 123 mg/dL — ABNORMAL HIGH (ref 70–99)

## 2021-01-31 SURGERY — CANCELLED PROCEDURE
Anesthesia: Monitor Anesthesia Care

## 2021-01-31 MED ORDER — SODIUM CHLORIDE 0.9 % IV SOLN: INTRAVENOUS | Status: DC

## 2021-01-31 MED ORDER — SODIUM CHLORIDE (PF) 0.9 % IJ SOLN: 10.0000 mL | INTRAMUSCULAR | Status: DC | PRN

## 2021-01-31 SURGICAL SUPPLY — 15 items

## 2021-01-31 NOTE — Progress Notes (Signed)
Patient admitted to endoscopy unit.  HR in 30s.  Per anesthesia patient will need to be rescheduled.  Dr. Okey Dupre contacted PCP to be seen today.  Dr. Bosie Clos notified. Patient in agreement with plan of care

## 2021-01-31 NOTE — Progress Notes (Signed)
Primary Physician/Referring:  Kaleen Mask, MD  Patient ID: Peter Martin, male    DOB: 08-Nov-1929, 87 y.o.   MRN: [id number]  Chief Complaint  Patient presents with   Persistent atrial fibrillation    Bradycardia   Follow-up   HPI:    Jasmine Maceachern  is a 87 y.o. caucasian male with  History of coronary artery disease status post arthrectomy and angioplasty, hypertension, non-insulin-dependent diabetes mellitus type 2, advanced age, mild aortic stenosis and moderate MR/TR. He was originally referred to our office for management of atrial fibrillation.   In the past patient has been recommended to undergo nuclear stress test and start anticoagulation, however he has opted not to. Patient now presents for urgent visit at the request of his PCP. Patient was schedueld for endoscopy due to esophageal stricture, was supposed to undergo dilation.  However when he presented for endoscopy monitor revealed bradycardia with heart rate as low as 33 bpm.  He was sent to his PCPs office for urgent evaluation and continued to have bradycardia with heart rate in the 30s.  He now presents to our office.  Patient's heart rate typically runs low, he reports resting heart rate at home averages approximately 40-45 bpm.  He states he was a longtime runner and his heart rate has always been low.  Patient has 24/7 caregiver in view of his progressive dementia who monitor his blood pressure and heart rate.  Home blood pressure readings have been averaging 160s/70s mmHg since reduction of amlodipine from 10 mg to 5 mg due to edema.  Patient is asymptomatic, denies chest pain, palpitations, dyspnea, dizziness, syncope, near syncope.  He is able to do his daily activities without issue and goes on short walks with his caregivers which he enjoys.  Notably patient was evaluated by nephrology earlier this year due to increase of creatinine from 1.2-1.4 baseline two 1.5 now.  Suspected increased creatinine is a  reflection of progression of chronic kidney disease and recommended risk factor management, particularly blood pressure control.  Past Medical History:  Diagnosis Date   Atrial fibrillation (HCC)    Coronary artery disease    Dementia (HCC)    Diabetes mellitus without complication (HCC)    Hypertension    Past Surgical History:  Procedure Laterality Date   CORONARY ANGIOPLASTY     History reviewed. No pertinent family history.  Social History   Tobacco Use   Smoking status: Never   Smokeless tobacco: Never  Substance Use Topics   Alcohol use: Never   Marital Status: Widowed   ROS  Review of Systems  Cardiovascular:  Negative for chest pain, claudication, leg swelling, near-syncope, orthopnea, palpitations, paroxysmal nocturnal dyspnea and syncope.  Respiratory:  Negative for shortness of breath.   Neurological:  Negative for dizziness.   Objective  Blood pressure (!) 163/67, pulse (!) 43, temperature 98 F (36.7 C), resp. rate 17, height 5\' 9"  (1.753 m), weight 152 lb (68.9 kg), SpO2 97 %.  Vitals with BMI 01/31/2021 01/31/2021 01/27/2021  Height 5\' 9"  5\' 9"  5\' 9"   Weight 152 lbs 155 lbs 155 lbs  BMI 22.44 22.88 22.88  Systolic 163 185 -  Diastolic 67 63 -  Pulse 43 35 -      Physical Exam Vitals reviewed.  HENT:     Head: Normocephalic and atraumatic.  Cardiovascular:     Rate and Rhythm: Bradycardia present. Rhythm irregularly irregular.     Pulses: Intact distal pulses.     Heart sounds:  S1 normal and S2 normal. Murmur heard.  Systolic murmur is present with a grade of 2/6 at the upper right sternal border.  Systolic murmur of grade 2/6 is also present at the apex.    No gallop.  Pulmonary:     Effort: Pulmonary effort is normal. No respiratory distress.     Breath sounds: No wheezing, rhonchi or rales.  Musculoskeletal:     Right lower leg: No edema.     Left lower leg: No edema.  Neurological:     Mental Status: He is alert.    Laboratory  examination:   No results for input(s): NA, K, CL, CO2, GLUCOSE, BUN, CREATININE, CALCIUM, GFRNONAA, GFRAA in the last 8760 hours. CrCl cannot be calculated (No successful lab value found.).  No flowsheet data found. No flowsheet data found.  Lipid Panel No results for input(s): CHOL, TRIG, LDLCALC, VLDL, HDL, CHOLHDL, LDLDIRECT in the last 8760 hours.  HEMOGLOBIN A1C No results found for: HGBA1C, MPG TSH No results for input(s): TSH in the last 8760 hours.  External labs:   09/20/2020: Glucose 139, BUN 26, creatinine 1.04, GFR 64, sodium 141, potassium 4.0, Hemoglobin 14.1, hematocrit 41.1, MCV 94, platelet 221  Allergies  No Known Allergies   Medications Prior to Visit:   Outpatient Medications Prior to Visit  Medication Sig Dispense Refill   acetaminophen (TYLENOL) 500 MG tablet Take 500 mg by mouth in the morning and at bedtime.     donepezil (ARICEPT) 10 MG tablet Take 10 mg by mouth at bedtime.     lisinopril (ZESTRIL) 40 MG tablet Take 40 mg by mouth every evening.     amLODipine (NORVASC) 10 MG tablet Take 5 mg by mouth every evening.     doxycycline (VIBRAMYCIN) 100 MG capsule Take 1 capsule (100 mg total) by mouth 2 (two) times daily. (Patient not taking: Reported on 01/24/2021) 20 capsule 0   No facility-administered medications prior to visit.   Final Medications at End of Visit    Current Meds  Medication Sig   acetaminophen (TYLENOL) 500 MG tablet Take 500 mg by mouth in the morning and at bedtime.   donepezil (ARICEPT) 10 MG tablet Take 10 mg by mouth at bedtime.   hydrochlorothiazide (MICROZIDE) 12.5 MG capsule Take 1 capsule (12.5 mg total) by mouth daily.   lisinopril (ZESTRIL) 40 MG tablet Take 40 mg by mouth every evening.   [DISCONTINUED] amLODipine (NORVASC) 10 MG tablet Take 5 mg by mouth every evening.   Radiology:   No results found.  Cardiac Studies:   Echocardiogram 11/27/2019:  LVEF 67%, unable to evaluate diastolic function secondary to  atrial fibrillation, normal wall motion, mild LVH, mild aortic stenosis, mild AR, moderate MR moderate TR, mild PR, RVSP 31 mmHg.   Heart Catheterization 05/14/2000: INDICATIONS:  Mr. Alcoser is a 87 year old gentleman with a history of chest pain.  He underwent heart catheterization back in July and was found to have a tight stenosis and a moderate sized diagonal branch.  He was treated medically but continued to have chest pain even despite cardiac rehabilitation.  He was referred for repeat heart catheterization and possible rotational atherectomy. Conclusions: 1. Successful rotational atherectomy and percutaneous transluminal coronary  angioplasty of the first diagonal vessel. 2. Mild to moderate irregularities involving the other vessels. 3. Normal left ventricular systolic function.  EKG:   01/31/2021: Atrial fibrillation at a rate of 43 bpm.  12/17/2019: Atrial fibrillation, 58 bpm, LVH per voltage criteria, ST-T changes most likely  secondary to underlying LVH.  Assessment     ICD-10-CM   1. Persistent atrial fibrillation (HCC)  I48.19 EKG 12-Lead    2. Bradycardia  R00.1     3. Essential hypertension  I10 Basic metabolic panel       Medications Discontinued During This Encounter  Medication Reason   doxycycline (VIBRAMYCIN) 100 MG capsule Error   amLODipine (NORVASC) 10 MG tablet Change in therapy    Meds ordered this encounter  Medications   hydrochlorothiazide (MICROZIDE) 12.5 MG capsule    Sig: Take 1 capsule (12.5 mg total) by mouth daily.    Dispense:  30 capsule    Refill:  3    This patients CHA2DS2-VASc Score 5 (HTN, Age, DM, CAD) and yearly risk of stroke 7.2%.   Recommendations:   Peter Martin is a 87 y.o. caucasian male with  History of coronary artery disease status post arthrectomy and angioplasty, hypertension, non-insulin-dependent diabetes mellitus type 2, advanced age, mild aortic stenosis and moderate MR/TR. He was originally referred to our office  for management of atrial fibrillation.   Patient who presents for urgent visit at the request of his PCP due to bradycardia.  Patient was scheduled to undergo endoscopy this morning, however it was canceled as his heart rate was as low as 33 bpm.  In our office patient walked in the hallway and heart rate increased appropriately as expected demonstrating chronotropic competence.  He is asymptomatic from a bradycardia standpoint.  Therefore given chronotropic competence and lack of symptoms do not see indication for pacemaker at this time.  Patient is accompanied by his daughter at today's visit who provides significant portion of the history.  Discussed with her potential underlying ischemia as cause of bradycardia as well as patient's atrial fibrillation.  Again discussed the option of proceeding with nuclear stress test, however patient and his daughter continue to wish to hold off on further ischemic evaluation given advanced age and progressing dementia of patient.  Patient is markedly bradycardic at today's visit.  We will discontinue amlodipine at this time although do not feel this is likely contributing to significant bradycardia.  Patient's elevated blood pressure may also be due to marked bradycardia.  In regard to hypertension, as we are discontinuing amlodipine will start him on HCTZ 12.5 mg daily with repeat BMP in 1 week. Recommend avoiding AV nodal blocking agents given bradycardia.   In regard to atrial fibrillation again discussed with patient and his family regarding high thromboembolic risk and anticoagulation.  Again discussed indications, risks, benefits of anticoagulation.  Patient and his family continue to prefer to avoid oral anticoagulation.  Also again reviewed consideration of watchman device, patient's daughter states the family and patient did research and do not wish to proceed with evaluation for watchman.  Counseled patient and his daughter regarding signs and symptoms that  would warrant urgent or emergent evaluation, and/or consideration for pacemaker.  However upon further discussion patient's daughter states they would likely not be interested in pacemaker implantation, but "we will cross that bridge if they come to it".  Hold off on electrophysiology referral at this time, however if patient becomes symptomatic we will consider referral.  Follow-up in 6 weeks, sooner if needed, for bradycardia and hypertension.    Rayford Halsted, PA-C 02/01/2021, 11:56 AM Office: [phone]

## 2021-02-01 MED ORDER — HYDROCHLOROTHIAZIDE 12.5 MG PO CAPS: 12.5000 mg | ORAL_CAPSULE | Freq: Every day | ORAL | 3 refills | Status: DC

## 2021-02-09 ENCOUNTER — Telehealth: Payer: Self-pay

## 2021-02-11 LAB — BASIC METABOLIC PANEL
BUN/Creatinine Ratio: 16 (ref 10–24)
BUN: 23 mg/dL (ref 8–27)
CO2: 23 mmol/L (ref 20–29)
Calcium: 9.8 mg/dL (ref 8.6–10.2)
Chloride: 102 mmol/L (ref 96–106)
Creatinine, Ser: 1.44 mg/dL — ABNORMAL HIGH (ref 0.76–1.27)
Glucose: 212 mg/dL — ABNORMAL HIGH (ref 65–99)
Potassium: 3.7 mmol/L (ref 3.5–5.2)
Sodium: 140 mmol/L (ref 134–144)
eGFR: 46 mL/min/{1.73_m2} — ABNORMAL LOW (ref >59)

## 2021-02-13 ENCOUNTER — Other Ambulatory Visit: Payer: Self-pay | Admitting: Student

## 2021-02-13 MED ORDER — HYDRALAZINE HCL 25 MG PO TABS
25.0000 mg | ORAL_TABLET | Freq: Three times a day (TID) | ORAL | 3 refills | Status: DC
Start: 2021-02-13 — End: 2021-03-27

## 2021-02-13 NOTE — Progress Notes (Signed)
Called and spoke to pts daughter regarding hydralazine. She would like to know what side effects she should look for when pt starts taking it.

## 2021-02-13 NOTE — Progress Notes (Signed)
pt aware 

## 2021-02-13 NOTE — Progress Notes (Signed)
Please inform patient his kidney function suffered with HCTZ. Stop HCTZ and start hydralazine 25 mg TID instead for hypertension.

## 2021-02-13 NOTE — Progress Notes (Signed)
Attempted to call pt, no answer. Left vm requesting call back.

## 2021-02-13 NOTE — Progress Notes (Signed)
Keep a eye out for dizziness, SOB, fatigue. If he has any of those check his BP and call us because low BP can cause those symptoms.

## 2021-02-17 ENCOUNTER — Telehealth: Payer: Self-pay

## 2021-02-17 NOTE — Telephone Encounter (Signed)
Spoke to the patient's daughter and she voiced understanding MAR has been updated

## 2021-02-17 NOTE — Telephone Encounter (Signed)
DBP trends to be lower as we age.  But yes, hydralazine can effect that as well.  Have him reduce his hydralazine to bid and see how he does. Please update the MAR.  ST

## 2021-02-17 NOTE — Telephone Encounter (Signed)
Pts daughter called again and stated that the pts diastolic bp is low. His diastolic bp is around 42-56. His systolic has been normal, around 117. She said that the pt believes it is because of the hydralazine. She would like to know what they can do to bring the diastolic bp up. Please advise.

## 2021-03-07 ENCOUNTER — Encounter (HOSPITAL_COMMUNITY): Payer: Self-pay

## 2021-03-07 ENCOUNTER — Emergency Department (HOSPITAL_COMMUNITY): Payer: Medicare Other

## 2021-03-07 ENCOUNTER — Telehealth: Payer: Self-pay

## 2021-03-07 ENCOUNTER — Inpatient Hospital Stay (HOSPITAL_COMMUNITY)
Admission: EM | Admit: 2021-03-07 | Discharge: 2021-03-10 | DRG: 291 | Disposition: A | Discharge status: Home Health | Payer: Medicare Other | Attending: Internal Medicine | Admitting: Internal Medicine

## 2021-03-07 DIAGNOSIS — N183 Chronic kidney disease, stage 3 unspecified: Secondary | ICD-10-CM

## 2021-03-07 DIAGNOSIS — I480 Paroxysmal atrial fibrillation: Secondary | ICD-10-CM | POA: Diagnosis present

## 2021-03-07 DIAGNOSIS — Z79899 Other long term (current) drug therapy: Secondary | ICD-10-CM | POA: Diagnosis not present

## 2021-03-07 DIAGNOSIS — F0390 Unspecified dementia without behavioral disturbance: Secondary | ICD-10-CM | POA: Insufficient documentation

## 2021-03-07 DIAGNOSIS — I160 Hypertensive urgency: Secondary | ICD-10-CM

## 2021-03-07 DIAGNOSIS — I5031 Acute diastolic (congestive) heart failure: Secondary | ICD-10-CM | POA: Diagnosis present

## 2021-03-07 DIAGNOSIS — R930 Abnormal findings on diagnostic imaging of skull and head, not elsewhere classified: Secondary | ICD-10-CM | POA: Diagnosis not present

## 2021-03-07 DIAGNOSIS — Z20822 Contact with and (suspected) exposure to covid-19: Secondary | ICD-10-CM | POA: Diagnosis present

## 2021-03-07 DIAGNOSIS — Z66 Do not resuscitate: Secondary | ICD-10-CM | POA: Diagnosis present

## 2021-03-07 DIAGNOSIS — L89151 Pressure ulcer of sacral region, stage 1: Secondary | ICD-10-CM | POA: Diagnosis present

## 2021-03-07 DIAGNOSIS — L8990 Pressure ulcer of unspecified site, unspecified stage: Secondary | ICD-10-CM | POA: Insufficient documentation

## 2021-03-07 DIAGNOSIS — I2510 Atherosclerotic heart disease of native coronary artery without angina pectoris: Secondary | ICD-10-CM | POA: Diagnosis present

## 2021-03-07 DIAGNOSIS — I420 Dilated cardiomyopathy: Secondary | ICD-10-CM | POA: Diagnosis present

## 2021-03-07 DIAGNOSIS — R011 Cardiac murmur, unspecified: Secondary | ICD-10-CM

## 2021-03-07 DIAGNOSIS — Z9861 Coronary angioplasty status: Secondary | ICD-10-CM

## 2021-03-07 DIAGNOSIS — E1122 Type 2 diabetes mellitus with diabetic chronic kidney disease: Secondary | ICD-10-CM | POA: Diagnosis present

## 2021-03-07 DIAGNOSIS — E1165 Type 2 diabetes mellitus with hyperglycemia: Secondary | ICD-10-CM | POA: Diagnosis present

## 2021-03-07 DIAGNOSIS — I509 Heart failure, unspecified: Secondary | ICD-10-CM | POA: Diagnosis present

## 2021-03-07 DIAGNOSIS — R410 Disorientation, unspecified: Secondary | ICD-10-CM

## 2021-03-07 DIAGNOSIS — N1831 Chronic kidney disease, stage 3a: Secondary | ICD-10-CM | POA: Diagnosis not present

## 2021-03-07 DIAGNOSIS — E119 Type 2 diabetes mellitus without complications: Secondary | ICD-10-CM

## 2021-03-07 DIAGNOSIS — I130 Hypertensive heart and chronic kidney disease with heart failure and stage 1 through stage 4 chronic kidney disease, or unspecified chronic kidney disease: Principal | ICD-10-CM | POA: Diagnosis present

## 2021-03-07 LAB — CBC WITH DIFFERENTIAL/PLATELET
Abs Immature Granulocytes: 0.08 10*3/uL — ABNORMAL HIGH (ref 0.00–0.07)
Basophils Absolute: 0 10*3/uL (ref 0.0–0.1)
Basophils Relative: 0 %
Eosinophils Absolute: 0 10*3/uL (ref 0.0–0.5)
Eosinophils Relative: 0 %
HCT: 45.3 % (ref 39.0–52.0)
Hemoglobin: 15.3 g/dL (ref 13.0–17.0)
Immature Granulocytes: 1 %
Lymphocytes Relative: 8 %
Lymphs Abs: 0.8 10*3/uL (ref 0.7–4.0)
MCH: 32.4 pg (ref 26.0–34.0)
MCHC: 33.8 g/dL (ref 30.0–36.0)
MCV: 96 fL (ref 80.0–100.0)
Monocytes Absolute: 1 10*3/uL (ref 0.1–1.0)
Monocytes Relative: 11 %
Neutro Abs: 7.3 10*3/uL (ref 1.7–7.7)
Neutrophils Relative %: 80 %
Platelets: 128 10*3/uL — ABNORMAL LOW (ref 150–400)
RBC: 4.72 MIL/uL (ref 4.22–5.81)
RDW: 14.3 % (ref 11.5–15.5)
WBC: 9.1 10*3/uL (ref 4.0–10.5)
nRBC: 0 % (ref 0.0–0.2)

## 2021-03-07 LAB — COMPREHENSIVE METABOLIC PANEL
ALT: 90 U/L — ABNORMAL HIGH (ref 0–44)
AST: 92 U/L — ABNORMAL HIGH (ref 15–41)
Albumin: 3.4 g/dL — ABNORMAL LOW (ref 3.5–5.0)
Alkaline Phosphatase: 157 U/L — ABNORMAL HIGH (ref 38–126)
Anion gap: 9 (ref 5–15)
BUN: 22 mg/dL (ref 8–23)
CO2: 21 mmol/L — ABNORMAL LOW (ref 22–32)
Calcium: 9.1 mg/dL (ref 8.9–10.3)
Chloride: 103 mmol/L (ref 98–111)
Creatinine, Ser: 1.27 mg/dL — ABNORMAL HIGH (ref 0.61–1.24)
GFR, Estimated: 54 mL/min — ABNORMAL LOW (ref >60)
Glucose, Bld: 205 mg/dL — ABNORMAL HIGH (ref 70–99)
Potassium: 3.8 mmol/L (ref 3.5–5.1)
Sodium: 133 mmol/L — ABNORMAL LOW (ref 135–145)
Total Bilirubin: 3 mg/dL — ABNORMAL HIGH (ref 0.3–1.2)
Total Protein: 7.5 g/dL (ref 6.5–8.1)

## 2021-03-07 LAB — RESP PANEL BY RT-PCR (FLU A&B, COVID) ARPGX2
Influenza A by PCR: NEGATIVE
Influenza B by PCR: NEGATIVE
SARS Coronavirus 2 by RT PCR: NEGATIVE

## 2021-03-07 LAB — BRAIN NATRIURETIC PEPTIDE: B Natriuretic Peptide: 2477.4 pg/mL — ABNORMAL HIGH (ref 0.0–100.0)

## 2021-03-07 LAB — CBG MONITORING, ED: Glucose-Capillary: 202 mg/dL — ABNORMAL HIGH (ref 70–99)

## 2021-03-07 LAB — TROPONIN I (HIGH SENSITIVITY)
Troponin I (High Sensitivity): 45 ng/L — ABNORMAL HIGH (ref <18)
Troponin I (High Sensitivity): 53 ng/L — ABNORMAL HIGH (ref <18)

## 2021-03-07 MED ORDER — INSULIN ASPART 100 UNIT/ML IJ SOLN: 0.0000 [IU] | Freq: Every day | INTRAMUSCULAR | Status: DC

## 2021-03-07 MED ORDER — ONDANSETRON HCL 4 MG/2ML IJ SOLN
4.0000 mg | Freq: Four times a day (QID) | INTRAMUSCULAR | Status: DC | PRN
  Administered 2021-03-08: 4 mg via INTRAVENOUS
  Filled 2021-03-07: qty 2

## 2021-03-07 MED ORDER — SODIUM CHLORIDE 0.9% FLUSH
3.0000 mL | Freq: Two times a day (BID) | INTRAVENOUS | Status: DC
  Administered 2021-03-08 – 2021-03-10 (×6): 3 mL via INTRAVENOUS

## 2021-03-07 MED ORDER — DONEPEZIL HCL 10 MG PO TABS
10.0000 mg | ORAL_TABLET | Freq: Every day | ORAL | Status: DC
  Administered 2021-03-08 – 2021-03-09 (×3): 10 mg via ORAL
  Filled 2021-03-07 (×3): qty 1

## 2021-03-07 MED ORDER — ACETAMINOPHEN 325 MG PO TABS
650.0000 mg | ORAL_TABLET | Freq: Four times a day (QID) | ORAL | Status: DC | PRN
  Administered 2021-03-08 – 2021-03-09 (×3): 650 mg via ORAL
  Filled 2021-03-07 (×3): qty 2

## 2021-03-07 MED ORDER — INSULIN ASPART 100 UNIT/ML IJ SOLN
0.0000 [IU] | Freq: Three times a day (TID) | INTRAMUSCULAR | Status: DC
  Administered 2021-03-08: 1 [IU] via SUBCUTANEOUS
  Administered 2021-03-08: 3 [IU] via SUBCUTANEOUS
  Administered 2021-03-08: 2 [IU] via SUBCUTANEOUS
  Administered 2021-03-09: 3 [IU] via SUBCUTANEOUS
  Administered 2021-03-09 (×2): 2 [IU] via SUBCUTANEOUS
  Administered 2021-03-10: 1 [IU] via SUBCUTANEOUS
  Administered 2021-03-10: 2 [IU] via SUBCUTANEOUS

## 2021-03-07 MED ORDER — ACETAMINOPHEN 650 MG RE SUPP: 650.0000 mg | Freq: Four times a day (QID) | RECTAL | Status: DC | PRN

## 2021-03-07 MED ORDER — LISINOPRIL 40 MG PO TABS
40.0000 mg | ORAL_TABLET | Freq: Every day | ORAL | Status: DC
  Administered 2021-03-08 – 2021-03-09 (×3): 40 mg via ORAL
  Filled 2021-03-07 (×3): qty 1

## 2021-03-07 MED ORDER — ONDANSETRON HCL 4 MG PO TABS: 4.0000 mg | ORAL_TABLET | Freq: Four times a day (QID) | ORAL | Status: DC | PRN

## 2021-03-07 MED ORDER — HYDRALAZINE HCL 20 MG/ML IJ SOLN
10.0000 mg | Freq: Once | INTRAMUSCULAR | Status: AC
  Administered 2021-03-07: 10 mg via INTRAVENOUS
  Filled 2021-03-07: qty 1

## 2021-03-07 MED ORDER — FUROSEMIDE 10 MG/ML IJ SOLN
20.0000 mg | Freq: Two times a day (BID) | INTRAMUSCULAR | Status: AC
  Administered 2021-03-08 – 2021-03-09 (×3): 20 mg via INTRAVENOUS
  Filled 2021-03-07 (×3): qty 2

## 2021-03-07 MED ORDER — SODIUM CHLORIDE 0.9% FLUSH: 3.0000 mL | INTRAVENOUS | Status: DC | PRN

## 2021-03-07 MED ORDER — FUROSEMIDE 10 MG/ML IJ SOLN
20.0000 mg | Freq: Once | INTRAMUSCULAR | Status: AC
  Administered 2021-03-07: 20 mg via INTRAVENOUS
  Filled 2021-03-07: qty 2

## 2021-03-07 MED ORDER — ALBUTEROL SULFATE (2.5 MG/3ML) 0.083% IN NEBU: 2.5000 mg | INHALATION_SOLUTION | RESPIRATORY_TRACT | Status: DC | PRN

## 2021-03-07 MED ORDER — SODIUM CHLORIDE 0.9 % IV SOLN: 250.0000 mL | INTRAVENOUS | Status: DC | PRN

## 2021-03-07 NOTE — ED Notes (Signed)
Report called to [facility] RN 3E

## 2021-03-07 NOTE — ED Notes (Signed)
Condom cath applied to pt. 

## 2021-03-07 NOTE — ED Provider Notes (Signed)
Patient presenting today with several days of chest discomfort, some shortness of breath, significantly elevated blood pressure and mild confusion.  He has not had significant cough or fever.  Patient initially upon evaluation had a sinus rhythm with bigeminy but on repeat evaluation is now in atrial fibrillation.  He has been started on hydralazine 3 times daily 5 weeks ago due to ongoing elevated blood pressure.  He had been discontinued on amlodipine due to her low resting heart rate.  However the blood pressure has not been well controlled.  Today patient's labs with mild elevated LFTs with an AST of 92 and ALT of 90 but stable creatinine of 1.27, CBC without acute findings, initial troponin elevated at 45 without old to compare and chest x-ray showing a small left pleural effusion and atelectasis with mild cardiomegaly and vascular congestion.  Concerned that patient is having hypertensive urgency versus intermittent runs of A. fib and early CHF.  Will give Lasix.  Also head CT due to patient's worsening confusion showed left occipital lobe hypoattenuation suspicious for ischemia of an indeterminate acuity which also may be adding into why his blood pressure has been difficult to control.  Will admit for further care.  Patient was given IV hydralazine here due to blood pressure of 230/100 with improvement to 171/89.   Gwyneth Sprout, MD 03/07/21 2029

## 2021-03-07 NOTE — Telephone Encounter (Signed)
Patients daughter called and said that pt had chest pain last night, ems was called and ekg was done, pt was given a few aspirin as well. BP is also high 180/84 hr 68. BP 168/86 hr 62. Per CC pt is to take hydralazine 25 mg tid and monitor bp and call us if chest pain returns. Pt has appt with CC 03/15/21

## 2021-03-07 NOTE — ED Provider Notes (Signed)
[facility] EMERGENCY DEPARTMENT Provider Note   CSN: [id number] Arrival date & time: 03/07/21  1708     History Chief Complaint  Patient presents with   Hypertension   Chest Pain    Saint Hank is a 87 y.o. male past medical history of dementia, A. fib, hypertension, diabetes presenting to the ED with a chief complaint of chest pain.  History is provided by daughter over the phone.  For the past 2 to 3 days patient has been complaining of intermittent chest pain and nausea.  He has had low energy for the past several weeks.  Also concerned about his generalized weakness.  His last episode of chest pain was last night.  He has a caretaker at home and is provided with his home medications by the caretaker.  No injuries or falls.  Unable to tell me exactly if he is more altered than usual.  \Patient states that he "feels all right" denies any complaints. HPI     Past Medical History:  Diagnosis Date   Atrial fibrillation (HCC)    Coronary artery disease    Dementia (HCC)    Diabetes mellitus without complication (HCC)    Hypertension     Patient Active Problem List   Diagnosis Date Noted   A-fib (HCC) 05/11/2020   Angioectopia 05/11/2020    Past Surgical History:  Procedure Laterality Date   CORONARY ANGIOPLASTY         No family history on file.  Social History   Tobacco Use   Smoking status: Never   Smokeless tobacco: Never  Vaping Use   Vaping Use: Never used  Substance Use Topics   Alcohol use: Never   Drug use: Never    Home Medications Prior to Admission medications   Medication Sig Start Date End Date Taking? Authorizing Provider  acetaminophen (TYLENOL) 500 MG tablet Take 500 mg by mouth in the morning and at bedtime.    [provider]  donepezil (ARICEPT) 10 MG tablet Take 10 mg by mouth at bedtime. 04/13/20   [provider]  hydrALAZINE (APRESOLINE) 25 MG tablet Take 1 tablet (25 mg total) by mouth 3 (three)  times daily. 02/13/21 06/13/21  Cantwell, Celeste C, PA-C  lisinopril (ZESTRIL) 40 MG tablet Take 40 mg by mouth every evening. 07/14/20   [provider]    Allergies    Patient has no known allergies.  Review of Systems   Review of Systems  Unable to perform ROS: Dementia   Physical Exam Updated Vital Signs BP (!) 150/83   Pulse 80   Temp 98.2 F (36.8 C) (Oral)   Resp 20   SpO2 93%   Physical Exam Vitals and nursing note reviewed.  Constitutional:      General: He is not in acute distress.    Appearance: He is well-developed.  HENT:     Head: Normocephalic and atraumatic.     Nose: Nose normal.  Eyes:     General: No scleral icterus.       Left eye: No discharge.     Conjunctiva/sclera: Conjunctivae normal.  Cardiovascular:     Rate and Rhythm: Normal rate and regular rhythm.     Heart sounds: Normal heart sounds. No murmur heard.   No friction rub. No gallop.  Pulmonary:     Effort: Pulmonary effort is normal. No respiratory distress.     Breath sounds: Normal breath sounds.  Abdominal:     General: Bowel sounds are  normal. There is no distension.     Palpations: Abdomen is soft.     Tenderness: There is no abdominal tenderness. There is no guarding.  Musculoskeletal:        General: Normal range of motion.     Cervical back: Normal range of motion and neck supple.  Skin:    General: Skin is warm and dry.     Findings: No rash.  Neurological:     Mental Status: He is alert.     Cranial Nerves: No cranial nerve deficit.     Motor: No weakness or abnormal muscle tone.     Coordination: Coordination normal.     Comments: Strength 5/5 in bilateral upper and lower extremities.  No facial asymmetry.  Alert and oriented to self, knows he is in [place].  Can name his wife Windell Moulding.  Does not know the year or hospital that he is in.    ED Results / Procedures / Treatments   Labs (all labs ordered are listed, but only abnormal results are displayed) Labs  Reviewed  COMPREHENSIVE METABOLIC PANEL - Abnormal; Notable for the following components:      Result Value   Sodium 133 (*)    CO2 21 (*)    Glucose, Bld 205 (*)    Creatinine, Ser 1.27 (*)    Albumin 3.4 (*)    AST 92 (*)    ALT 90 (*)    Alkaline Phosphatase 157 (*)    Total Bilirubin 3.0 (*)    GFR, Estimated 54 (*)    All other components within normal limits  CBC WITH DIFFERENTIAL/PLATELET - Abnormal; Notable for the following components:   Platelets 128 (*)    Abs Immature Granulocytes 0.08 (*)    All other components within normal limits  CBG MONITORING, ED - Abnormal; Notable for the following components:   Glucose-Capillary 202 (*)    All other components within normal limits  TROPONIN I (HIGH SENSITIVITY) - Abnormal; Notable for the following components:   Troponin I (High Sensitivity) 45 (*)    All other components within normal limits  URINE CULTURE  URINALYSIS, ROUTINE W REFLEX MICROSCOPIC  CBG MONITORING, ED    EKG EKG Interpretation  Date/Time:  Tuesday March 07 2021 17:14:38 EDT Ventricular Rate:  94 PR Interval:  134 QRS Duration: 102 QT Interval:  399 QTC Calculation: 402 R Axis:   218 Text Interpretation: Sinus or ectopic atrial rhythm new Ventricular bigeminy Probable anterior infarct, age indeterminate Confirmed by Gwyneth Sprout ([id number]) on 03/07/2021 5:26:29 PM  Radiology No results found.  Procedures Procedures   Medications Ordered in ED Medications  hydrALAZINE (APRESOLINE) injection 10 mg (10 mg Intravenous Given 03/07/21 1759)    ED Course  I have reviewed the triage vital signs and the nursing notes.  Pertinent labs & imaging results that were available during my care of the patient were reviewed by me and considered in my medical decision making (see chart for details).  Clinical Course as of 03/07/21 1854  Tue Mar 07, 2021  1749 BP(!): 232/94 I have ordered IV hydralazine. [HK]  1840 Creatinine(!): 1.27 [HK]  1840 Total  Bilirubin(!): 3.0 [HK]  1840 Glucose-Capillary(!): 202 [HK]  1840 Troponin I (High Sensitivity)(!): 45 [HK]  1840 RBC: 4.72 [HK]    Clinical Course User Index [HK] Dietrich Pates, PA-C   MDM Rules/Calculators/A&P  87 year old male with past medical history of dementia, A. fib, hypertension, diabetes presenting to the ED with a chief complaint of chest pain.  History provided by daughter.  Patient has been complaining of chest pain for the past 2 to 3 days intermittently.  Reports associated nausea.  No vomiting.  Also reports decreased energy level for the past few weeks and generalized weakness.  We assume that he has been compliant with his medication as he is scared after with a caretaker at home.  No injuries or falls.  History of dementia but unsure if he is more altered.  On exam patient without any complaints.  He has no neurological deficits, he is alert, oriented to his self but unable to tell me the year or location.  He is moving all extremities without difficulty.  He is hypertensive on arrival here to 230s systolic.  He will be given IV hydralazine while remainder of work-up is checked.  Improvement in his blood pressure with hydralazine to 150 systolic.  His creatinine is 1.27 which appears around baseline.  His T bili is elevated to 3 but no priors for comparison.  Troponin slightly elevated at 45.  EKG shows ectopic atrial rhythm with new bigeminy.  He is pending remainder of work-up including delta troponin and CT of the head. Care handed to oncoming provider pending remainder of workup and disposition.    Portions of this note were generated with Scientist, clinical (histocompatibility and immunogenetics). Dictation errors may occur despite best attempts at proofreading.  Final Clinical Impression(s) / ED Diagnoses Final diagnoses:  Hypertensive urgency    Rx / DC Orders ED Discharge Orders     None        Dietrich Pates, PA-C 03/07/21 1854    Gwyneth Sprout, MD 03/07/21  2145

## 2021-03-07 NOTE — ED Notes (Signed)
Tried to call 3E to call report.

## 2021-03-07 NOTE — ED Triage Notes (Signed)
Pt arrives via EMS from home. EMS reports that the caregiver stated that the patient has been "acting sluggish at home" over the past couple of days. Pt is at his baseline gcs of 14. Pt reports intermittent cp over the past couple of days as well. EMS reports a BP of 220/100

## 2021-03-07 NOTE — H&P (Signed)
History and Physical    [name] BPZ:[id number] DOB: [birth year] DOA: 03/07/2021  PCP: Kaleen Mask, MD   Patient coming from: Home  Chief Complaint: Chest pain, shortness of breath, confusion. elevated blood pressure   HPI: Peter Martin is a 87 y.o. male with medical history significant for HTN, DMT2 diet controlled, PAF, esophageal stricture, CKD 3, dementia, CAD who presents for evaluation of chest pain intermittently associated with some SOB. Has had confusion since last night. He has a 24 hour caregiver who lives with him. He has been having high BP readings the past few days. Daughter reports that he was taken off norvasc a month ago due to swelling in legs and low heart rate. He was started on hydralazine in place of norvasc and has been having increased BP readings since then. He states chest pain is in left upper chest and does not radiate but he is not able to further characterize the pain.  He has not had any nausea or vomiting, fever, syncope, abdominal pain.  He does have some swelling of his legs but his daughter states it was worse when he was taking the Norvasc.  He states that his speech has been normal he has been more confused than his baseline.  He had increased weakness for the last day or so but he has not had any falls at home.   ED Course: Mr. Mich is an elevated blood pressure of 230/100 in the emergency room.  He was given hydralazine with improvement to 170/89.  He has not had fever.  Lab work reveals a WBC of 9100, hemoglobin 15.3, hematocrit 45.3, no Account 128,000, sodium 133, potassium 3.8, chloride 103, bicarb 21, creatinine 1.27 which is around his baseline, BUN 22, glucose 205, alkaline phosphatase 157, AST 92, ALT 90, albumin 3.4.  Troponin 45.  CT of the head reveals a area in the left occipital region age indeterminant.  Chest x-ray reveals mild cardiomegaly with pulmonary vascular congestion.  COVID-19 swab is pending.  MRI of the brain has been ordered  and is pending  Review of Systems:  Unable to obtain review of system from patient secondary to dementia.  Daughter reports the patient did complain of chest pain for last 2 days intermittently and has had increased confusion today.  Has had elevated blood pressure.   Past Medical History:  Diagnosis Date   Atrial fibrillation (HCC)    Coronary artery disease    Dementia (HCC)    Diabetes mellitus without complication (HCC)    Hypertension     Past Surgical History:  Procedure Laterality Date   CORONARY ANGIOPLASTY      Social History  reports that he has never smoked. He has never used smokeless tobacco. He reports that he does not drink alcohol and does not use drugs.  No Known Allergies  History reviewed. No pertinent family history.   Prior to Admission medications   Medication Sig Start Date End Date Taking? Authorizing Provider  acetaminophen (TYLENOL) 500 MG tablet Take 500 mg by mouth in the morning and at bedtime.   Yes [provider]  donepezil (ARICEPT) 10 MG tablet Take 10 mg by mouth daily in the afternoon. 04/13/20  Yes [provider]  hydrALAZINE (APRESOLINE) 25 MG tablet Take 1 tablet (25 mg total) by mouth 3 (three) times daily. Patient taking differently: Take 25 mg by mouth in the morning and at bedtime. 02/13/21 06/13/21 Yes Cantwell, Celeste C, PA-C  lisinopril (ZESTRIL) 40 MG tablet Take  40 mg by mouth daily in the afternoon. 07/14/20  Yes [provider]    Physical Exam: Vitals:   03/07/21 1759 03/07/21 1801 03/07/21 1830 03/07/21 1930  BP: (!) 189/111 (!) 215/91 (!) 150/83 (!) 171/89  Pulse:  (!) 55 80 71  Resp:  (!) 22 20 (!) 31  Temp:  98.2 F (36.8 C)    TempSrc:  Oral    SpO2:  98% 93% 97%    Constitutional: NAD, calm, comfortable Vitals:   03/07/21 1759 03/07/21 1801 03/07/21 1830 03/07/21 1930  BP: (!) 189/111 (!) 215/91 (!) 150/83 (!) 171/89  Pulse:  (!) 55 80 71  Resp:  (!) 22 20 (!) 31  Temp:  98.2 F  (36.8 C)    TempSrc:  Oral    SpO2:  98% 93% 97%   General: WDWN, Alert and oriented to person and place  Eyes: EOMI, PERRL, conjunctivae normal.  Sclera nonicteric HENT:  [place]/AT, external ears normal.  Nares patent without epistasis.  Mucous membranes are moist. Posterior pharynx clear of any exudate or lesions. Dentures in place.  Neck: Soft, normal range of motion, supple, no masses, Trachea midline Respiratory: clear to auscultation bilaterally, no wheezing, no crackles. Normal respiratory effort. No accessory muscle use.  Cardiovascular: Regular rate and rhythm, 3/6 systolic murmur. No rubs / gallops. +1 lower extremity edema. 2+ pedal pulses. No carotid bruits.  Abdomen: Soft, no tenderness, nondistended, no rebound or guarding.  No masses palpated. No hepatosplenomegaly. Bowel sounds normoactive Musculoskeletal: FROM. no cyanosis. No joint deformity upper and lower extremities. Normal muscle tone.  Skin: Warm, dry, intact no rashes, lesions, ulcers. No induration Neurologic: CN 2-12 grossly intact.  Normal speech.  Sensation intact to touch, patella DTR +1 bilaterally. Strength 4/5 in all extremities.   Psychiatric: Normal judgment and insight.  Normal mood.    Labs on Admission: I have personally reviewed following labs and imaging studies  CBC: Recent Labs  Lab 03/07/21 1741  WBC 9.1  NEUTROABS 7.3  HGB 15.3  HCT 45.3  MCV 96.0  PLT 128*    Basic Metabolic Panel: Recent Labs  Lab 03/07/21 1741  NA 133*  K 3.8  CL 103  CO2 21*  GLUCOSE 205*  BUN 22  CREATININE 1.27*  CALCIUM 9.1    GFR: CrCl cannot be calculated (Unknown ideal weight.).  Liver Function Tests: Recent Labs  Lab 03/07/21 1741  AST 92*  ALT 90*  ALKPHOS 157*  BILITOT 3.0*  PROT 7.5  ALBUMIN 3.4*    Urine analysis: No results found for: COLORURINE, APPEARANCEUR, LABSPEC, PHURINE, GLUCOSEU, HGBUR, BILIRUBINUR, KETONESUR, PROTEINUR, UROBILINOGEN, NITRITE, LEUKOCYTESUR  Radiological  Exams on Admission: CT HEAD WO CONTRAST (5MM)  Result Date: 03/07/2021 CLINICAL DATA:  Mental status changes. EXAM: CT HEAD WITHOUT CONTRAST TECHNIQUE: Contiguous axial images were obtained from the base of the skull through the vertex without intravenous contrast. COMPARISON:  None. FINDINGS: Brain: Moderate low density in the periventricular white matter likely related to small vessel disease. Expected cerebral and cerebellar atrophy for age. No hemorrhage, hydrocephalus, intra-axial, or extra-axial fluid collection. Hypoattenuation within the left occipital region including on 14/3, 37/6 and 64/5. Vascular: No hyperdense vessel or unexpected calcification. Advanced intracranial atherosclerosis. Skull: Normal Sinuses/Orbits: Normal imaged portions of the orbits and globes. Left ethmoid air cell and sphenoid sinus mucosal thickening. Clear mastoid air cells. Cerumen in the external ear canals. Other: None. IMPRESSION: Left occipital lobe hypoattenuation is suspicious for ischemia of indeterminate acuity. Correlate with localizing symptoms.  Otherwise, no acute intracranial abnormality. Cerebral atrophy and small vessel ischemic change. Sinus disease. Electronically Signed   By: Jeronimo Greaves M.D.   On: 03/07/2021 18:55   DG Chest Port 1 View  Result Date: 03/07/2021 CLINICAL DATA:  Chest pain, hypertension EXAM: PORTABLE CHEST 1 VIEW COMPARISON:  None. FINDINGS: Small left pleural effusion is present with associated left basilar atelectasis or infiltrate. The lungs are otherwise clear. No pneumothorax. No pleural effusion on the right. Cardiac size is mildly enlarged. There is central pulmonary vascular congestion without overt pulmonary edema in keeping with changes of early cardiogenic failure or mild volume overload. Atherosclerotic calcifications seen within the thoracic aorta. No acute bone abnormality. IMPRESSION: Small left pleural effusion with associated left basilar atelectasis or infiltrate. Mild  cardiomegaly. Central pulmonary vascular congestion in keeping with early cardiogenic failure or mild volume overload. Electronically Signed   By: Helyn Numbers MD   On: 03/07/2021 19:45    EKG: Independently reviewed.  EKG shows sinus rhythm with ventricular bigeminy.  Mild posterior ST depression.  QTc 402.  Patient has short burst of atrial fibrillation with telemetry monitor  Assessment/Plan Principal Problem:   Acute CHF (congestive heart failure) Mr. Avitabile is admitted to Cardiac telemetry floor.  BNP has been ordered in Er and is pending. Troponin mildly elevated and will check serial troponin levels.  Lasix for diuresis provided. Monitor I&Os and daily weight.  Check Echocardiogram in am to evaluate wall motion, EF and valvular function.  Active Problems:   Hypertensive urgency Elevated blood pressure readings.  Awaiting MRI of the brain to admit if acute stroke occurred.  If acute stroke is present will need permissive hypertension for next 24 hours. Continue home dose of lisinopril starting in the morning.  Further recommendations to follow after MRI Monitor blood pressure    AF (paroxysmal atrial fibrillation)  History of paroxysmal atrial fibrillation.  Not on anticoagulation secondary to risk of falls and bleeding per daughter.    Diabetes mellitus type 2 in nonobese  Mild hyperglycemia when patient arrived emergency room.  Blood sugars with meals and at bedtime.  Insulin has been ordered as needed for hyperglycemia Check hemoglobin A1c    CKD (chronic kidney disease) stage 3 A Stable.  Monitor electrolytes and renal function with labs in morning    Confusion Patient with slightly more confusion than normal according to daughter.  Evaluating for CVA with MRI of the brain    Heart murmur Echocardiogram in the morning Daughter states she is never told that he has had a heart murmur in the past.    Abnormal CT of the head Patient with left occipital lesion of  indeterminate age.  MRI ordered to further evaluate.  If stroke present will need permissive hypertension for the next 24 hours and neurology consult    DVT prophylaxis: SCD's for DVT prophylaxis. Daughter does not want to use anticoagulation  Code Status:   Full Code  Family Communication:  Diagnosis and plan discussed with patient and his daughter who is at bedside.  Questions answered.  They verbalized understanding and agree with plan.  Further recommendations to follow as clinical indicated Disposition Plan:   Patient is from:  Home  Anticipated DC to:  Home.  Patient has 24-hour caregiver at home  Anticipated DC date:  Anticipate 2 midnight stay in hospital  Anticipated DC barriers: No barriers to discharge identified at this time  Admission status:  Inpatient   Carlton Adam MD [facility]  How to contact the [facility] Attending or Consulting provider 7A - 7P or covering provider during after hours 7P -7A, for this patient?   Check the care team in [facility] and look for a) attending/consulting [facility] provider listed and b) the [facility] team listed Log into [email or web address] and use [place]'s universal password to access. If you do not have the password, please contact the hospital operator. Locate the [facility] provider you are looking for under [facility] and page to a number that you can be directly reached. If you still have difficulty reaching the provider, please page the [facility] (Director on Call) for the Hospitalists listed on amion for assistance.  03/07/2021, 9:16 PM

## 2021-03-08 ENCOUNTER — Inpatient Hospital Stay (HOSPITAL_COMMUNITY): Payer: Medicare Other

## 2021-03-08 ENCOUNTER — Other Ambulatory Visit: Payer: Self-pay

## 2021-03-08 DIAGNOSIS — L8990 Pressure ulcer of unspecified site, unspecified stage: Secondary | ICD-10-CM | POA: Insufficient documentation

## 2021-03-08 DIAGNOSIS — I160 Hypertensive urgency: Secondary | ICD-10-CM | POA: Diagnosis not present

## 2021-03-08 DIAGNOSIS — I480 Paroxysmal atrial fibrillation: Secondary | ICD-10-CM

## 2021-03-08 DIAGNOSIS — R930 Abnormal findings on diagnostic imaging of skull and head, not elsewhere classified: Secondary | ICD-10-CM

## 2021-03-08 DIAGNOSIS — I509 Heart failure, unspecified: Secondary | ICD-10-CM | POA: Diagnosis not present

## 2021-03-08 DIAGNOSIS — N1831 Chronic kidney disease, stage 3a: Secondary | ICD-10-CM

## 2021-03-08 DIAGNOSIS — E119 Type 2 diabetes mellitus without complications: Secondary | ICD-10-CM

## 2021-03-08 LAB — BASIC METABOLIC PANEL
Anion gap: 9 (ref 5–15)
BUN: 22 mg/dL (ref 8–23)
CO2: 22 mmol/L (ref 22–32)
Calcium: 9.1 mg/dL (ref 8.9–10.3)
Chloride: 104 mmol/L (ref 98–111)
Creatinine, Ser: 1.34 mg/dL — ABNORMAL HIGH (ref 0.61–1.24)
GFR, Estimated: 51 mL/min — ABNORMAL LOW (ref >60)
Glucose, Bld: 188 mg/dL — ABNORMAL HIGH (ref 70–99)
Potassium: 4.5 mmol/L (ref 3.5–5.1)
Sodium: 135 mmol/L (ref 135–145)

## 2021-03-08 LAB — GLUCOSE, CAPILLARY
Glucose-Capillary: 131 mg/dL — ABNORMAL HIGH (ref 70–99)
Glucose-Capillary: 149 mg/dL — ABNORMAL HIGH (ref 70–99)
Glucose-Capillary: 169 mg/dL — ABNORMAL HIGH (ref 70–99)
Glucose-Capillary: 181 mg/dL — ABNORMAL HIGH (ref 70–99)
Glucose-Capillary: 184 mg/dL — ABNORMAL HIGH (ref 70–99)
Glucose-Capillary: 230 mg/dL — ABNORMAL HIGH (ref 70–99)

## 2021-03-08 LAB — URINALYSIS, ROUTINE W REFLEX MICROSCOPIC
Bilirubin Urine: NEGATIVE
Glucose, UA: NEGATIVE mg/dL
Hgb urine dipstick: NEGATIVE
Ketones, ur: NEGATIVE mg/dL
Leukocytes,Ua: NEGATIVE
Nitrite: NEGATIVE
Protein, ur: NEGATIVE mg/dL
Specific Gravity, Urine: 1.008 (ref 1.005–1.030)
pH: 5 (ref 5.0–8.0)

## 2021-03-08 LAB — TROPONIN I (HIGH SENSITIVITY)
Troponin I (High Sensitivity): 63 ng/L — ABNORMAL HIGH (ref <18)
Troponin I (High Sensitivity): 63 ng/L — ABNORMAL HIGH (ref <18)

## 2021-03-08 LAB — ECHOCARDIOGRAM COMPLETE
AR max vel: 1.26 cm2
AV Area VTI: 1.13 cm2
AV Area mean vel: 1.03 cm2
AV Mean grad: 22 mmHg
AV Peak grad: 34.7 mmHg
Ao pk vel: 2.94 m/s
Area-P 1/2: 3.72 cm2
Height: 70 in
S' Lateral: 2.7 cm
Weight: 2437.41 oz

## 2021-03-08 LAB — HEMOGLOBIN A1C
Hgb A1c MFr Bld: 6.4 % — ABNORMAL HIGH (ref 4.8–5.6)
Mean Plasma Glucose: 136.98 mg/dL

## 2021-03-08 LAB — CBC
HCT: 42.8 % (ref 39.0–52.0)
Hemoglobin: 14.4 g/dL (ref 13.0–17.0)
MCH: 31.7 pg (ref 26.0–34.0)
MCHC: 33.6 g/dL (ref 30.0–36.0)
MCV: 94.3 fL (ref 80.0–100.0)
Platelets: 137 10*3/uL — ABNORMAL LOW (ref 150–400)
RBC: 4.54 MIL/uL (ref 4.22–5.81)
RDW: 14.3 % (ref 11.5–15.5)
WBC: 10 10*3/uL (ref 4.0–10.5)
nRBC: 0 % (ref 0.0–0.2)

## 2021-03-08 LAB — TSH: TSH: 2.201 u[IU]/mL (ref 0.350–4.500)

## 2021-03-08 LAB — VITAMIN B12: Vitamin B-12: 300 pg/mL (ref 180–914)

## 2021-03-08 MED ORDER — DM-GUAIFENESIN ER 30-600 MG PO TB12: 1.0000 | ORAL_TABLET | Freq: Two times a day (BID) | ORAL | Status: DC | PRN

## 2021-03-08 MED ORDER — HYDRALAZINE HCL 20 MG/ML IJ SOLN
10.0000 mg | INTRAMUSCULAR | Status: DC | PRN
  Administered 2021-03-09 – 2021-03-10 (×2): 10 mg via INTRAVENOUS
  Filled 2021-03-08 (×2): qty 1

## 2021-03-08 MED ORDER — METOPROLOL TARTRATE 5 MG/5ML IV SOLN
5.0000 mg | INTRAVENOUS | Status: DC | PRN
  Administered 2021-03-09: 5 mg via INTRAVENOUS
  Filled 2021-03-08: qty 5

## 2021-03-08 MED ORDER — LORAZEPAM 2 MG/ML IJ SOLN
0.5000 mg | Freq: Once | INTRAMUSCULAR | Status: AC
  Administered 2021-03-08: 0.5 mg via INTRAVENOUS
  Filled 2021-03-08: qty 1

## 2021-03-08 NOTE — Progress Notes (Signed)
Physical Therapy Evaluation Patient Details Name: Peter Martin MRN: [id number] DOB: 08-Nov-1929 Today's Date: 03/08/2021   History of Present Illness  Pt is an 87yo male presenting to [facility] ED on 8/2 with chest pain, SOB, and elevated BP. Likely secondary to acute CHF. MRI showed no acute findings. PMH: dementia, HTN, CAD, DM, CKD3, and PAF.  Clinical Impression  Pt presents with the impairments and problems above. Pt performed bed mobility and transfers with min guard for safety. Ambulation with RW required min assist for steadying with multimodal cuing for safety. Pt and daughter educated on importance of using walker when at home for safety. Pt has 24/7 caregivers. Recommending HHPT at discharge. We will continue to follow patient acutely to promote functional independence with mobility.     Follow Up Recommendations Home health PT;Supervision/Assistance - 24 hour    Equipment Recommendations  Rolling walker with 5" wheels    Recommendations for Other Services       Precautions / Restrictions Precautions Precautions: Fall Restrictions Weight Bearing Restrictions: No      Mobility  Bed Mobility Overal bed mobility: Needs Assistance Bed Mobility: Supine to Sit     Supine to sit: Min assist;Min guard;+2 for safety/equipment     General bed mobility comments: Pt required min assist for advancement of LEs off bed; min guard for safety    Transfers Overall transfer level: Needs assistance Equipment used: None Transfers: Sit to/from Stand Sit to Stand: Min assist;+2 safety/equipment         General transfer comment: Pt min assist for steadying  Ambulation/Gait Ambulation/Gait assistance: Min assist;+2 safety/equipment Gait Distance (Feet): 100 Feet Assistive device: Rolling walker (2 wheeled) Gait Pattern/deviations: Shuffle;Decreased step length - right;Decreased step length - left;Decreased dorsiflexion - right;Decreased dorsiflexion - left;Drifts right/left Gait  velocity: decreased   General Gait Details: Pt min assist for steadying and safety; also required manual assist for RW. Demonstrated drift to the left and generally with shuffling steps.  Took many small steps to turn and required assist for safe use of RW.  Stairs            Wheelchair Mobility    Modified Rankin (Stroke Patients Only)       Balance Overall balance assessment: Needs assistance Sitting-balance support: Feet supported Sitting balance-Leahy Scale: Fair     Standing balance support: Bilateral upper extremity supported;During functional activity Standing balance-Leahy Scale: Fair Standing balance comment: Pt reliant on BUE support on RW                             Pertinent Vitals/Pain Pain Assessment: No/denies pain    Home Living Family/patient expects to be discharged to:: Private residence Living Arrangements: Other (Comment) (24 hour caregivers) Available Help at Discharge: Family;Available 24 hours/day;Personal care attendant Type of Home: House Home Access: Level entry     Home Layout: One level Home Equipment: Toilet riser;Grab bars - tub/shower;Hand held shower head;Walker - 4 wheels Additional Comments: Daughter provided majority of home living information secondary to pt's dementia; recently purchased a rollator.    Prior Function Level of Independence: Needs assistance   Gait / Transfers Assistance Needed: Pt has started using new rollator for ambulation  ADL's / Homemaking Assistance Needed: assistance provided by caregivers including showering, dressing, meal prep, cleaning. Daughter reports increased level of assistance lately.  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     Higher education careers adviser  Extremity/Trunk Assessment   Upper Extremity Assessment Upper Extremity Assessment: Defer to OT evaluation    Lower Extremity Assessment Lower Extremity Assessment: Generalized weakness     Cervical / Trunk Assessment Cervical / Trunk Assessment: Kyphotic  Communication   Communication: No difficulties  Cognition Arousal/Alertness: Awake/alert Behavior During Therapy: WFL for tasks assessed/performed Overall Cognitive Status: History of cognitive impairments - at baseline                                 General Comments: Pt has dementia at baseline      General Comments      Exercises     Assessment/Plan    PT Assessment Patient needs continued PT services  PT Problem List Decreased strength;Decreased activity tolerance;Decreased balance;Decreased mobility;Decreased coordination;Decreased cognition;Decreased knowledge of use of DME;Decreased safety awareness       PT Treatment Interventions DME instruction;Gait training;Functional mobility training;Therapeutic activities;Therapeutic exercise;Balance training;Cognitive remediation;Patient/family education    PT Goals (Current goals can be found in the Care Plan section)  Acute Rehab PT Goals Patient Stated Goal: to go home PT Goal Formulation: With patient/family Time For Goal Achievement: 03/22/21 Potential to Achieve Goals: Good    Frequency Min 3X/week   Barriers to discharge        Co-evaluation PT/OT/SLP Co-Evaluation/Treatment: Yes Reason for Co-Treatment: For patient/therapist safety;Necessary to address cognition/behavior during functional activity PT goals addressed during session: Mobility/safety with mobility;Proper use of DME         AM-PAC PT "6 Clicks" Mobility  Outcome Measure Help needed turning from your back to your side while in a flat bed without using bedrails?: A Little Help needed moving from lying on your back to sitting on the side of a flat bed without using bedrails?: A Little Help needed moving to and from a bed to a chair (including a wheelchair)?: A Little Help needed standing up from a chair using your arms (e.g., wheelchair or bedside chair)?: A  Little Help needed to walk in hospital room?: A Little Help needed climbing 3-5 steps with a railing? : A Lot 6 Click Score: 17    End of Session Equipment Utilized During Treatment: Gait belt Activity Tolerance: Patient tolerated treatment well Patient left: in chair;with call bell/phone within reach;with chair alarm set;with family/visitor present Nurse Communication: Mobility status PT Visit Diagnosis: Muscle weakness (generalized) (M62.81);Difficulty in walking, not elsewhere classified (R26.2)    Time: 6568-1275 PT Time Calculation (min) (ACUTE ONLY): 25 min   Charges:   PT Evaluation $PT Eval Moderate Complexity: 1 Mod          Mizraim Harmening, SPT  Wendy Mikles 03/08/2021, 2:07 PM

## 2021-03-08 NOTE — Progress Notes (Addendum)
To MRI. Transporter via bed. Asleep.  0430H, back to room 3E22. Asleep. Attached to monitor.

## 2021-03-08 NOTE — Plan of Care (Signed)
  Problem: Clinical Measurements: Goal: Ability to maintain clinical measurements within normal limits will improve Outcome: Progressing Goal: Will remain free from infection Outcome: Progressing Goal: Diagnostic test results will improve Outcome: Progressing Goal: Respiratory complications will improve Outcome: Progressing Goal: Cardiovascular complication will be avoided Outcome: Progressing   Problem: Safety: Goal: Ability to remain free from injury will improve Outcome: Progressing   Problem: Skin Integrity: Goal: Risk for impaired skin integrity will decrease Outcome: Progressing   Problem: Cardiac: Goal: Ability to achieve and maintain adequate cardiopulmonary perfusion will improve Outcome: Progressing   Problem: Ischemic Stroke/TIA Tissue Perfusion: Goal: Complications of ischemic stroke/TIA will be minimized Outcome: Progressing

## 2021-03-08 NOTE — Evaluation (Signed)
Occupational Therapy Evaluation Patient Details Name: Peter Martin MRN: [id number] DOB: Sep 08, 1929 Today's Date: 03/08/2021    History of Present Illness Pt is an 87yo male presenting to [facility] ED on 8/2 with chest pain, SOB, and elevated BP. Likely secondary to acute CHF. MRI showed no acute findings. PMH: dementia, HTN, CAD, DM, CKD3, and PAF.   Clinical Impression   Pt typically lives at home with 24 hour caregivers and daughter happily involved. He enjoys Ezzie Dural and used to be competitive runner. Today Pt is able to complete transfers initially with min A +2 progressing to min A, overall max A for LB ADL and set up/min A for UB ADL. At this time recommending OT follow acutely focusing on activity tolerance, balance, caregiver and patient education for use of DME (take rollator next session) to ensure return to PLOF.    Follow Up Recommendations  No OT follow up;Supervision/Assistance - 24 hour    Equipment Recommendations  None recommended by OT (Pt has appropriate DME)    Recommendations for Other Services       Precautions / Restrictions Precautions Precautions: Fall Restrictions Weight Bearing Restrictions: No      Mobility Bed Mobility Overal bed mobility: Needs Assistance Bed Mobility: Supine to Sit     Supine to sit: Min assist;Min guard;+2 for safety/equipment     General bed mobility comments: Pt required min assist for advancement of LEs off bed; min guard for safety    Transfers Overall transfer level: Needs assistance Equipment used: None Transfers: Sit to/from Stand Sit to Stand: Min assist;+2 safety/equipment         General transfer comment: Pt min assist for steadying    Balance Overall balance assessment: Needs assistance Sitting-balance support: Feet supported Sitting balance-Leahy Scale: Fair     Standing balance support: Bilateral upper extremity supported;During functional activity Standing balance-Leahy Scale: Fair Standing balance  comment: Pt reliant on BUE support on RW                           ADL either performed or assessed with clinical judgement   ADL Overall ADL's : Needs assistance/impaired Eating/Feeding: Set up;Sitting   Grooming: Minimal assistance;Standing Grooming Details (indicate cue type and reason): fatigues quickly Upper Body Bathing: Minimal assistance;Sitting Upper Body Bathing Details (indicate cue type and reason): uses a shower chair at baseline Lower Body Bathing: Maximal assistance;With caregiver independent assisting   Upper Body Dressing : Moderate assistance;Sitting   Lower Body Dressing: Moderate assistance;Sitting/lateral leans Lower Body Dressing Details (indicate cue type and reason): to don socks/slippers Toilet Transfer: Min guard;Minimal assistance;Ambulation;RW;Cueing for safety;Cueing for sequencing Toilet Transfer Details (indicate cue type and reason): Pt still learning how to use RW Toileting- Clothing Manipulation and Hygiene: Moderate assistance;Sit to/from stand   Tub/ Shower Transfer: Minimal assistance;With caregiver independent assisting;Ambulation;Shower seat;Grab bars   Functional mobility during ADLs: Min guard;Minimal assistance;Rolling walker;Cueing for safety;Cueing for sequencing       Vision Baseline Vision/History: Wears glasses Wears Glasses: At all times Patient Visual Report: No change from baseline       Perception     Praxis      Pertinent Vitals/Pain Pain Assessment: No/denies pain     Hand Dominance     Extremity/Trunk Assessment Upper Extremity Assessment Upper Extremity Assessment: Overall WFL for tasks assessed;Generalized weakness   Lower Extremity Assessment Lower Extremity Assessment: Defer to PT evaluation   Cervical / Trunk Assessment Cervical / Trunk Assessment: Kyphotic  Communication Communication Communication: No difficulties   Cognition Arousal/Alertness: Awake/alert Behavior During Therapy: WFL  for tasks assessed/performed Overall Cognitive Status: History of cognitive impairments - at baseline                                 General Comments: Pt has dementia at baseline   General Comments  Daughter Peter Martin (sp?) present throughout. 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 Also likes Ezzie Dural music    Exercises     Shoulder Instructions      Home Living Family/patient expects to be discharged to:: Private residence Living Arrangements: Other (Comment) (24 hour caregivers) Available Help at Discharge: Family;Available 24 hours/day;Personal care attendant Type of Home: House Home Access: Level entry     Home Layout: One level     Bathroom Shower/Tub: Producer, television/film/video: Handicapped height     Home Equipment: Toilet riser;Grab bars - tub/shower;Hand held Careers information officer - 4 wheels   Additional Comments: Daughter provided majority of home living information secondary to pt's dementia; recently purchased a rollator.      Prior Functioning/Environment Level of Independence: Needs assistance  Gait / Transfers Assistance Needed: Pt has started using new rollator for ambulation ADL's / Homemaking Assistance Needed: assistance provided by caregivers including showering, dressing, meal prep, cleaning. Daughter reports increased level of assistance lately.   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        OT Problem List: Decreased activity tolerance;Impaired balance (sitting and/or standing);Decreased knowledge of use of DME or AE      OT Treatment/Interventions: Self-care/ADL training;DME and/or AE instruction;Therapeutic activities;Patient/family education    OT Goals(Current goals can be found in the care plan section) Acute Rehab OT Goals Patient Stated Goal: to go home OT Goal Formulation: With patient/family Time For Goal Achievement: 03/22/21 Potential to Achieve Goals: Good ADL  Goals Pt Will Perform Grooming: with supervision;standing Pt Will Transfer to Toilet: with min guard assist;ambulating;grab bars Pt Will Perform Toileting - Clothing Manipulation and hygiene: with min guard assist;with caregiver independent in assisting;sit to/from stand Additional ADL Goal #1: Pt will demonstrate bed mobility at supervision level prior to engaging in ADL routine  OT Frequency: Min 2X/week   Barriers to D/C:            Co-evaluation PT/OT/SLP Co-Evaluation/Treatment: Yes Reason for Co-Treatment: Necessary to address cognition/behavior during functional activity;For patient/therapist safety;To address functional/ADL transfers PT goals addressed during session: Balance;Mobility/safety with mobility;Proper use of DME OT goals addressed during session: ADL's and self-care      AM-PAC OT "6 Clicks" Daily Activity     Outcome Measure Help from another person eating meals?: A Little Help from another person taking care of personal grooming?: A Little Help from another person toileting, which includes using toliet, bedpan, or urinal?: A Lot Help from another person bathing (including washing, rinsing, drying)?: A Lot Help from another person to put on and taking off regular upper body clothing?: A Little Help from another person to put on and taking off regular lower body clothing?: A Lot 6 Click Score: 15   End of Session Equipment Utilized During Treatment: Gait belt;Rolling walker Nurse Communication: Mobility status  Activity Tolerance: Patient tolerated treatment well Patient left: in chair;with call bell/phone within reach;with chair alarm set;with family/visitor present  OT Visit Diagnosis: Unsteadiness on feet (R26.81);Muscle weakness (generalized) (M62.81)  Time: 9747-1855 OT Time Calculation (min): 25 min Charges:  OT General Charges $OT Visit: 1 Visit OT Evaluation $OT Eval Moderate Complexity: 1 Mod  Nyoka Cowden OTR/L Acute Rehabilitation  Services Pager: [phone] Office: [phone]  Evern Bio Theodus Ran 03/08/2021, 2:35 PM

## 2021-03-08 NOTE — Progress Notes (Signed)
PROGRESS NOTE    Peter Martin  DSK:[id number] DOB: 1930-07-03 DOA: 03/07/2021 PCP: Kaleen Mask, MD   Brief Narrative:  87 y.o. male with medical history significant for HTN, DMT2 diet controlled, PAF, esophageal stricture, CKD 3, dementia, CAD who presents for evaluation of chest pain intermittently associated with some SOB. Has had confusion since last night. He has a 24 hour caregiver who lives with him. He has been having high BP readings the past few days.  Recently his Norvasc was changed to hydralazine due to lower extremity swelling.  Upon admission CT head showed left occipital lobe age-indeterminate infarct.  MRI brain was negative for any acute pathology but did show chronic ischemic changes.  Also concerned for CHF exacerbation and hypertensive urgency therefore admitted to the hospital.   Assessment & Plan:   Principal Problem:   Acute CHF (congestive heart failure) (HCC) Active Problems:   AF (paroxysmal atrial fibrillation) (HCC)   CKD (chronic kidney disease) stage 3, GFR 30-59 ml/min (HCC)   Hypertensive urgency   Confusion   Abnormal CT of the head   Diabetes mellitus type 2 in nonobese (HCC)   Heart murmur   Pressure injury of skin   Acute CHF (congestive heart failure) -Currently patient is on telemetry.  Elevated BNP - Troponins remain flat.  EKG does not show any acute ischemic changes - Echocardiogram-.  EF 55%, grade 2 DD, elevated PA pressures, dilated cardiomyopathy. Moderate AS -Lasix 20 mg every 12 hours   Hypertensive urgency History of essential hypertension -CTA chills chronic extensive small vessel disease confirmed on MRI in - Slowly normalize blood pressure over next 5-7 days.  Paroxysmal AF (paroxysmal atrial fibrillation) -Not on long-term anticoagulation due to risk of falls - Rate is currently controlled     Diabetes mellitus type 2 in nonobese -Appears to be diet controlled.  Hemoglobin A1c 6.4.     CKD (chronic kidney disease)  stage 3 A -Creatinine around baseline of 1.3.  Closely monitor     Confusion with history of dementia -Suspect secondary to chronic brain changes from infarcts.  No signs of infection.  UA is negative.  I suspect he has some form of vascular dementia. - TSH, B12-normal - Daily Aricept   Spoke with the patient and family regarding goals of care conversation.  Patient is DNR/DNI  PT/OT   DVT prophylaxis: SCDs Start: 03/07/21 2351 Code Status: DNR/DNI Family Communication: Daughter is present at bedside  Status is: Inpatient  Remains inpatient appropriate because:Inpatient level of care appropriate due to severity of illness  Dispo: The patient is from: Home              Anticipated d/c is to: Home              Patient currently is not medically stable to d/c.  Still requiring IV diuretic treatment.  Closely monitor his blood pressure.  Hopefully we can discharge in next 24 hours   Difficult to place patient No       Nutritional status           Body mass index is 21.86 kg/m.  Pressure Injury 03/08/21 Coccyx Mid Stage 1 -  Intact skin with non-blanchable redness of a localized area usually over a bony prominence. (Active)  03/08/21 0113  Location: Coccyx  Location Orientation: Mid  Staging: Stage 1 -  Intact skin with non-blanchable redness of a localized area usually over a bony prominence.  Wound Description (Comments):   Present on Admission:  Yes          Subjective: Sitting up in the bed, answers basic question.  Daughter is present at her bedside who tells me her mentation is/close to baseline.  She has noticed overall decline in his mental condition over the past year.  Would like to transition patient to DNR/DNI.Bufford ButtnerMehta  Review of Systems Otherwise negative except as per HPI, including: General: Denies fever, chills, night sweats or unintended weight loss. Resp: Denies cough, wheezing, shortness of breath. Cardiac: Denies chest pain, palpitations,  orthopnea, paroxysmal nocturnal dyspnea. GI: Denies abdominal pain, nausea, vomiting, diarrhea or constipation GU: Denies dysuria, frequency, hesitancy or incontinence MS: Denies muscle aches, joint pain or swelling Neuro: Denies headache, neurologic deficits (focal weakness, numbness, tingling), abnormal gait Psych: Denies anxiety, depression, SI/HI/AVH Skin: Denies new rashes or lesions ID: Denies sick contacts, exotic exposures, travel  Examination:  General exam: Appears calm and comfortable  Respiratory system: Clear to auscultation. Respiratory effort normal. Cardiovascular system: S1 & S2 heard, RRR. No JVD, murmurs, rubs, gallops or clicks. No pedal edema. Gastrointestinal system: Abdomen is nondistended, soft and nontender. No organomegaly or masses felt. Normal bowel sounds heard. Central nervous system: Alert and oriented X2-name and place which is his baseline. No focal neurological deficits. Extremities: Symmetric 5 x 5 power. Skin: No rashes, lesions or ulcers Psychiatry: Poor judgment and insight    Objective: Vitals:   03/08/21 0310 03/08/21 0328 03/08/21 0600 03/08/21 0752  BP: (!) 168/94  (!) 152/66 136/81  Pulse:    64  Resp: (!) 23   19  Temp: 98.2 F (36.8 C)   98.7 F (37.1 C)  TempSrc: Oral   Oral  SpO2: 96%   96%  Weight:  69.1 kg    Height:        Intake/Output Summary (Last 24 hours) at 03/08/2021 0801 Last data filed at 03/08/2021 0800 Gross per 24 hour  Intake 240 ml  Output 1075 ml  Net -835 ml   Filed Weights   03/07/21 2356 03/08/21 0000 03/08/21 0328  Weight: 69.1 kg 69.1 kg 69.1 kg     Data Reviewed:   CBC: Recent Labs  Lab 03/07/21 1741 03/08/21 0009  WBC 9.1 10.0  NEUTROABS 7.3  --   HGB 15.3 14.4  HCT 45.3 42.8  MCV 96.0 94.3  PLT 128* 137*   Basic Metabolic Panel: Recent Labs  Lab 03/07/21 1741 03/08/21 0009  NA 133* 135  K 3.8 4.5  CL 103 104  CO2 21* 22  GLUCOSE 205* 188*  BUN 22 22  CREATININE 1.27* 1.34*   CALCIUM 9.1 9.1   GFR: Estimated Creatinine Clearance: 36.5 mL/min (A) (by C-G formula based on SCr of 1.34 mg/dL (H)). Liver Function Tests: Recent Labs  Lab 03/07/21 1741  AST 92*  ALT 90*  ALKPHOS 157*  BILITOT 3.0*  PROT 7.5  ALBUMIN 3.4*   No results for input(s): LIPASE, AMYLASE in the last 168 hours. No results for input(s): AMMONIA in the last 168 hours. Coagulation Profile: No results for input(s): INR, PROTIME in the last 168 hours. Cardiac Enzymes: No results for input(s): CKTOTAL, CKMB, CKMBINDEX, TROPONINI in the last 168 hours. BNP (last 3 results) No results for input(s): PROBNP in the last 8760 hours. HbA1C: Recent Labs    03/08/21 0009  HGBA1C 6.4*   CBG: Recent Labs  Lab 03/07/21 1717 03/08/21 0022 03/08/21 0613  GLUCAP 202* 181* 169*   Lipid Profile: No results for input(s): CHOL, HDL, LDLCALC, TRIG,  CHOLHDL, LDLDIRECT in the last 72 hours. Thyroid Function Tests: No results for input(s): TSH, T4TOTAL, FREET4, T3FREE, THYROIDAB in the last 72 hours. Anemia Panel: No results for input(s): VITAMINB12, FOLATE, FERRITIN, TIBC, IRON, RETICCTPCT in the last 72 hours. Sepsis Labs: No results for input(s): PROCALCITON, LATICACIDVEN in the last 168 hours.  Recent Results (from the past 240 hour(s))  Resp Panel by RT-PCR (Flu A&B, Covid) Nasopharyngeal Swab     Status: None   Collection Time: 03/07/21  7:23 PM   Specimen: Nasopharyngeal Swab; Nasopharyngeal(NP) swabs in vial transport medium  Result Value Ref Range Status   SARS Coronavirus 2 by RT PCR NEGATIVE NEGATIVE Final    Comment: (NOTE) SARS-CoV-2 target nucleic acids are NOT DETECTED.  The SARS-CoV-2 RNA is generally detectable in upper respiratory specimens during the acute phase of infection. The lowest concentration of SARS-CoV-2 viral copies this assay can detect is 138 copies/mL. A negative result does not preclude SARS-Cov-2 infection and should not be used as the sole basis for  treatment or other patient management decisions. A negative result may occur with  improper specimen collection/handling, submission of specimen other than nasopharyngeal swab, presence of viral mutation(s) within the areas targeted by this assay, and inadequate number of viral copies(<138 copies/mL). A negative result must be combined with clinical observations, patient history, and epidemiological information. The expected result is Negative.  Fact Sheet for Patients:  BloggerCourse.com  Fact Sheet for Healthcare Providers:  SeriousBroker.it  This test is no t yet approved or cleared by the [place] FDA and  has been authorized for detection and/or diagnosis of SARS-CoV-2 by FDA under an Emergency Use Authorization (EUA). This EUA will remain  in effect (meaning this test can be used) for the duration of the COVID-19 declaration under Section 564(b)(1) of the Act, 21 U.S.C.section 360bbb-3(b)(1), unless the authorization is terminated  or revoked sooner.       Influenza A by PCR NEGATIVE NEGATIVE Final   Influenza B by PCR NEGATIVE NEGATIVE Final    Comment: (NOTE) The Xpert Xpress SARS-CoV-2/FLU/RSV plus assay is intended as an aid in the diagnosis of influenza from Nasopharyngeal swab specimens and should not be used as a sole basis for treatment. Nasal washings and aspirates are unacceptable for Xpert Xpress SARS-CoV-2/FLU/RSV testing.  Fact Sheet for Patients: BloggerCourse.com  Fact Sheet for Healthcare Providers: SeriousBroker.it  This test is not yet approved or cleared by the [place] FDA and has been authorized for detection and/or diagnosis of SARS-CoV-2 by FDA under an Emergency Use Authorization (EUA). This EUA will remain in effect (meaning this test can be used) for the duration of the COVID-19 declaration under Section 564(b)(1) of the Act, 21  U.S.C. section 360bbb-3(b)(1), unless the authorization is terminated or revoked.  Performed at [facility], [address] [place], [place] [address]          Radiology Studies: CT HEAD WO CONTRAST ( )  Result Date: 03/07/2021 CLINICAL DATA:  Mental status changes. EXAM: CT HEAD WITHOUT CONTRAST TECHNIQUE: Contiguous axial images were obtained from the base of the skull through the vertex without intravenous contrast. COMPARISON:  None. FINDINGS: Brain: Moderate low density in the periventricular white matter likely related to small vessel disease. Expected cerebral and cerebellar atrophy for age. No hemorrhage, hydrocephalus, intra-axial, or extra-axial fluid collection. Hypoattenuation within the left occipital region including on 14/3, 37/6 and 64/5. Vascular: No hyperdense vessel or unexpected calcification. Advanced intracranial atherosclerosis. Skull: Normal Sinuses/Orbits: Normal imaged portions of  the orbits and globes. Left ethmoid air cell and sphenoid sinus mucosal thickening. Clear mastoid air cells. Cerumen in the external ear canals. Other: None. IMPRESSION: Left occipital lobe hypoattenuation is suspicious for ischemia of indeterminate acuity. Correlate with localizing symptoms. Otherwise, no acute intracranial abnormality. Cerebral atrophy and small vessel ischemic change. Sinus disease. Electronically Signed   By: Jeronimo Greaves M.D.   On: 03/07/2021 18:55   MR BRAIN WO CONTRAST  Result Date: 03/08/2021 CLINICAL DATA:  Delirium EXAM: MRI HEAD WITHOUT CONTRAST TECHNIQUE: Multiplanar, multiecho pulse sequences of the brain and surrounding structures were obtained without intravenous contrast. COMPARISON:  Head CT from yesterday FINDINGS: Brain: No acute infarction, hemorrhage, hydrocephalus, extra-axial collection or mass lesion. The small left occipital cortex infarct highlighted on prior head CT is chronic. Extensive chronic small vessel ischemia in the hemispheric white  matter. Small remote left cerebellar infarct. Remote right basal ganglia perforator infarct. Generalized brain atrophy. Vascular: Preserved major flow voids Skull and upper cervical spine: Normal marrow signal Sinuses/Orbits: Partially opacified left sphenoid sinus and posterior ethmoid air cells. Nasopharyngeal retention cysts. IMPRESSION: 1. No acute or subacute insult. 2. Brain atrophy and extensive chronic small vessel ischemia. Electronically Signed   By: Marnee Spring M.D.   On: 03/08/2021 04:54   DG Chest Port 1 View  Result Date: 03/07/2021 CLINICAL DATA:  Chest pain, hypertension EXAM: PORTABLE CHEST 1 VIEW COMPARISON:  None. FINDINGS: Small left pleural effusion is present with associated left basilar atelectasis or infiltrate. The lungs are otherwise clear. No pneumothorax. No pleural effusion on the right. Cardiac size is mildly enlarged. There is central pulmonary vascular congestion without overt pulmonary edema in keeping with changes of early cardiogenic failure or mild volume overload. Atherosclerotic calcifications seen within the thoracic aorta. No acute bone abnormality. IMPRESSION: Small left pleural effusion with associated left basilar atelectasis or infiltrate. Mild cardiomegaly. Central pulmonary vascular congestion in keeping with early cardiogenic failure or mild volume overload. Electronically Signed   By: Helyn Numbers MD   On: 03/07/2021 19:45        Scheduled Meds:  donepezil  10 mg Oral q1600   furosemide  20 mg Intravenous Q12H   insulin aspart  0-5 Units Subcutaneous QHS   insulin aspart  0-9 Units Subcutaneous TID WC   lisinopril  40 mg Oral q1600   sodium chloride flush  3 mL Intravenous Q12H   Continuous Infusions:  sodium chloride       LOS: 1 day   Time spent= 35 mins    Jionni Helming Joline Maxcy, MD [facility]  If 7PM-7AM, please contact night-coverage  03/08/2021, 8:01 AM

## 2021-03-08 NOTE — Progress Notes (Signed)
  Echocardiogram 2D Echocardiogram has been performed.  Augustine Radar 03/08/2021, 10:11 AM

## 2021-03-09 LAB — MAGNESIUM: Magnesium: 1.9 mg/dL (ref 1.7–2.4)

## 2021-03-09 LAB — CBC
HCT: 39.6 % (ref 39.0–52.0)
Hemoglobin: 13.3 g/dL (ref 13.0–17.0)
MCH: 32 pg (ref 26.0–34.0)
MCHC: 33.6 g/dL (ref 30.0–36.0)
MCV: 95.4 fL (ref 80.0–100.0)
Platelets: 122 10*3/uL — ABNORMAL LOW (ref 150–400)
RBC: 4.15 MIL/uL — ABNORMAL LOW (ref 4.22–5.81)
RDW: 14.3 % (ref 11.5–15.5)
WBC: 9.5 10*3/uL (ref 4.0–10.5)
nRBC: 0 % (ref 0.0–0.2)

## 2021-03-09 LAB — BASIC METABOLIC PANEL
Anion gap: 10 (ref 5–15)
BUN: 29 mg/dL — ABNORMAL HIGH (ref 8–23)
CO2: 25 mmol/L (ref 22–32)
Calcium: 8.8 mg/dL — ABNORMAL LOW (ref 8.9–10.3)
Chloride: 98 mmol/L (ref 98–111)
Creatinine, Ser: 1.44 mg/dL — ABNORMAL HIGH (ref 0.61–1.24)
GFR, Estimated: 46 mL/min — ABNORMAL LOW (ref >60)
Glucose, Bld: 149 mg/dL — ABNORMAL HIGH (ref 70–99)
Potassium: 3.6 mmol/L (ref 3.5–5.1)
Sodium: 133 mmol/L — ABNORMAL LOW (ref 135–145)

## 2021-03-09 LAB — GLUCOSE, CAPILLARY
Glucose-Capillary: 175 mg/dL — ABNORMAL HIGH (ref 70–99)
Glucose-Capillary: 182 mg/dL — ABNORMAL HIGH (ref 70–99)
Glucose-Capillary: 158 mg/dL — ABNORMAL HIGH (ref 70–99)
Glucose-Capillary: 153 mg/dL — ABNORMAL HIGH (ref 70–99)

## 2021-03-09 LAB — URINE CULTURE: Culture: NO GROWTH

## 2021-03-09 LAB — FOLATE RBC
Folate, Hemolysate: 481 ng/mL
Folate, RBC: 1253 ng/mL (ref >498)
Hematocrit: 38.4 % (ref 37.5–51.0)

## 2021-03-09 MED ORDER — POTASSIUM CHLORIDE 20 MEQ PO PACK
40.0000 meq | PACK | Freq: Once | ORAL | Status: AC
  Administered 2021-03-09: 40 meq via ORAL
  Filled 2021-03-09: qty 2

## 2021-03-09 MED ORDER — HYDRALAZINE HCL 25 MG PO TABS
25.0000 mg | ORAL_TABLET | Freq: Two times a day (BID) | ORAL | Status: DC
  Administered 2021-03-09 (×2): 25 mg via ORAL
  Filled 2021-03-09 (×2): qty 1

## 2021-03-09 MED ORDER — FUROSEMIDE 10 MG/ML IJ SOLN: 40.0000 mg | Freq: Once | INTRAMUSCULAR | Status: DC

## 2021-03-09 MED ORDER — POTASSIUM CHLORIDE CRYS ER 20 MEQ PO TBCR: 40.0000 meq | EXTENDED_RELEASE_TABLET | Freq: Once | ORAL | Status: DC

## 2021-03-09 NOTE — Progress Notes (Addendum)
PROGRESS NOTE    Peter Martin  HKV:[id number] DOB: 26-Apr-1930 DOA: 03/07/2021 PCP: Kaleen Mask, MD   Brief Narrative:  87 y.o. male with medical history significant for HTN, DMT2 diet controlled, PAF, esophageal stricture, CKD 3, dementia, CAD who presents for evaluation of chest pain intermittently associated with some SOB. Has had confusion since last night. He has a 24 hour caregiver who lives with him. He has been having high BP readings the past few days.  Recently his Norvasc was changed to hydralazine due to lower extremity swelling.  Upon admission CT head showed left occipital lobe age-indeterminate infarct.  MRI brain was negative for any acute pathology but did show chronic ischemic changes.  Also concerned for CHF exacerbation and hypertensive urgency therefore admitted to the hospital.   Assessment & Plan:   Principal Problem:   Acute CHF (congestive heart failure) (HCC) Active Problems:   AF (paroxysmal atrial fibrillation) (HCC)   CKD (chronic kidney disease) stage 3, GFR 30-59 ml/min (HCC)   Hypertensive urgency   Confusion   Abnormal CT of the head   Diabetes mellitus type 2 in nonobese (HCC)   Heart murmur   Pressure injury of skin   Acute CHF (congestive heart failure) -Currently patient is on telemetry.  Elevated BNP - Troponins remain flat.  EKG does not show any acute ischemic changes - Echocardiogram-.  EF 55%, grade 2 DD, elevated PA pressures, dilated cardiomyopathy. Moderate AS - Lasix 40 mg IV today   Hypertensive urgency History of essential hypertension -CTA chills chronic extensive small vessel disease confirmed on MRI in -Continue lisinopril 40 mg daily.  Add hydralazine 25 mg twice daily, slowly titrate up  Paroxysmal AF (paroxysmal atrial fibrillation) -Not on long-term anticoagulation due to risk of falls - Rate is currently controlled     Diabetes mellitus type 2 in nonobese -Appears to be diet controlled.  Hemoglobin A1c 6.4.      CKD (chronic kidney disease) stage 3 A -Creatinine around baseline of 1.3.  Closely monitor     Confusion with history of dementia -Suspect secondary to chronic brain changes from infarcts.  No signs of infection.  UA is negative.  I suspect he has some form of vascular dementia. - TSH, B12-normal - Daily Aricept   Spoke with the patient and family regarding goals of care conversation.  Patient is DNR/DNI  PT/OT   DVT prophylaxis: SCDs Start: 03/07/21 2351 Code Status: DNR/DNI Family Communication: Daughter is present at bedside  Status is: Inpatient  Remains inpatient appropriate because:Inpatient level of care appropriate due to severity of illness  Dispo: The patient is from: Home              Anticipated d/c is to: Home              Patient currently is not medically stable to d/c.  Need better blood pressure management cortrak's 24 hours.  Hopefully home tomorrow   Difficult to place patient No       Nutritional status           Body mass index is 22.46 kg/m.  Pressure Injury 03/08/21 Coccyx Mid Stage 1 -  Intact skin with non-blanchable redness of a localized area usually over a bony prominence. (Active)  03/08/21 0113  Location: Coccyx  Location Orientation: Mid  Staging: Stage 1 -  Intact skin with non-blanchable redness of a localized area usually over a bony prominence.  Wound Description (Comments):   Present on Admission: Yes  Subjective: Feels okay no complaints at this time.  Blood pressure slightly elevated yesterday afternoon which daughter has been concerned about. Poor oral intake yesterday afternoon but better this morning  Review of Systems Otherwise negative except as per HPI, including: General: Denies fever, chills, night sweats or unintended weight loss. Resp: Denies cough, wheezing, shortness of breath. Cardiac: Denies chest pain, palpitations, orthopnea, paroxysmal nocturnal dyspnea. GI: Denies abdominal pain, nausea,  vomiting, diarrhea or constipation GU: Denies dysuria, frequency, hesitancy or incontinence MS: Denies muscle aches, joint pain or swelling Neuro: Denies headache, neurologic deficits (focal weakness, numbness, tingling), abnormal gait Psych: Denies anxiety, depression, SI/HI/AVH Skin: Denies new rashes or lesions ID: Denies sick contacts, exotic exposures, travel  Examination: Constitutional: Not in acute distress Respiratory: Clear to auscultation bilaterally Cardiovascular: Normal sinus rhythm, no rubs Abdomen: Nontender nondistended good bowel sounds Musculoskeletal: No edema noted Skin: No rashes seen Neurologic: CN 2-12 grossly intact.  And nonfocal Psychiatric: Poor judgment and insight.  Alert continued in place.  Objective: Vitals:   03/09/21 0030 03/09/21 0352 03/09/21 0404 03/09/21 0549  BP: (!) 164/84 (!) 180/84  (!) 169/68  Pulse: (!) 54 (!) 59  61  Resp: 17 16    Temp: 97.6 F (36.4 C) 97.7 F (36.5 C)    TempSrc: Oral Oral    SpO2: 95% 96%    Weight: 73.2 kg  71 kg   Height:        Intake/Output Summary (Last 24 hours) at 03/09/2021 0738 Last data filed at 03/08/2021 1329 Gross per 24 hour  Intake 240 ml  Output 375 ml  Net -135 ml   Filed Weights   03/08/21 0328 03/09/21 0030 03/09/21 0404  Weight: 69.1 kg 73.2 kg 71 kg     Data Reviewed:   CBC: Recent Labs  Lab 03/07/21 1741 03/08/21 0009 03/09/21 0343  WBC 9.1 10.0 9.5  NEUTROABS 7.3  --   --   HGB 15.3 14.4 13.3  HCT 45.3 42.8 39.6  MCV 96.0 94.3 95.4  PLT 128* 137* 122*   Basic Metabolic Panel: Recent Labs  Lab 03/07/21 1741 03/08/21 0009 03/09/21 0343  NA 133* 135 133*  K 3.8 4.5 3.6  CL 103 104 98  CO2 21* 22 25  GLUCOSE 205* 188* 149*  BUN 22 22 29*  CREATININE 1.27* 1.34* 1.44*  CALCIUM 9.1 9.1 8.8*  MG  --   --  1.9   GFR: Estimated Creatinine Clearance: 34.9 mL/min (A) (by C-G formula based on SCr of 1.44 mg/dL (H)). Liver Function Tests: Recent Labs  Lab  03/07/21 1741  AST 92*  ALT 90*  ALKPHOS 157*  BILITOT 3.0*  PROT 7.5  ALBUMIN 3.4*   No results for input(s): LIPASE, AMYLASE in the last 168 hours. No results for input(s): AMMONIA in the last 168 hours. Coagulation Profile: No results for input(s): INR, PROTIME in the last 168 hours. Cardiac Enzymes: No results for input(s): CKTOTAL, CKMB, CKMBINDEX, TROPONINI in the last 168 hours. BNP (last 3 results) No results for input(s): PROBNP in the last 8760 hours. HbA1C: Recent Labs    03/08/21 0009  HGBA1C 6.4*   CBG: Recent Labs  Lab 03/08/21 0852 03/08/21 1158 03/08/21 1634 03/08/21 2120 03/09/21 0554  GLUCAP 230* 149* 184* 131* 175*   Lipid Profile: No results for input(s): CHOL, HDL, LDLCALC, TRIG, CHOLHDL, LDLDIRECT in the last 72 hours. Thyroid Function Tests: Recent Labs    03/08/21 0851  TSH 2.201   Anemia Panel: Recent Labs  03/08/21 0851  VITAMINB12 300   Sepsis Labs: No results for input(s): PROCALCITON, LATICACIDVEN in the last 168 hours.  Recent Results (from the past 240 hour(s))  Resp Panel by RT-PCR (Flu A&B, Covid) Nasopharyngeal Swab     Status: None   Collection Time: 03/07/21  7:23 PM   Specimen: Nasopharyngeal Swab; Nasopharyngeal(NP) swabs in vial transport medium  Result Value Ref Range Status   SARS Coronavirus 2 by RT PCR NEGATIVE NEGATIVE Final    Comment: (NOTE) SARS-CoV-2 target nucleic acids are NOT DETECTED.  The SARS-CoV-2 RNA is generally detectable in upper respiratory specimens during the acute phase of infection. The lowest concentration of SARS-CoV-2 viral copies this assay can detect is 138 copies/mL. A negative result does not preclude SARS-Cov-2 infection and should not be used as the sole basis for treatment or other patient management decisions. A negative result may occur with  improper specimen collection/handling, submission of specimen other than nasopharyngeal swab, presence of viral mutation(s) within  the areas targeted by this assay, and inadequate number of viral copies(<138 copies/mL). A negative result must be combined with clinical observations, patient history, and epidemiological information. The expected result is Negative.  Fact Sheet for Patients:  BloggerCourse.com  Fact Sheet for Healthcare Providers:  SeriousBroker.it  This test is no t yet approved or cleared by the [place] FDA and  has been authorized for detection and/or diagnosis of SARS-CoV-2 by FDA under an Emergency Use Authorization (EUA). This EUA will remain  in effect (meaning this test can be used) for the duration of the COVID-19 declaration under Section 564(b)(1) of the Act, 21 U.S.C.section 360bbb-3(b)(1), unless the authorization is terminated  or revoked sooner.       Influenza A by PCR NEGATIVE NEGATIVE Final   Influenza B by PCR NEGATIVE NEGATIVE Final    Comment: (NOTE) The Xpert Xpress SARS-CoV-2/FLU/RSV plus assay is intended as an aid in the diagnosis of influenza from Nasopharyngeal swab specimens and should not be used as a sole basis for treatment. Nasal washings and aspirates are unacceptable for Xpert Xpress SARS-CoV-2/FLU/RSV testing.  Fact Sheet for Patients: BloggerCourse.com  Fact Sheet for Healthcare Providers: SeriousBroker.it  This test is not yet approved or cleared by the [place] FDA and has been authorized for detection and/or diagnosis of SARS-CoV-2 by FDA under an Emergency Use Authorization (EUA). This EUA will remain in effect (meaning this test can be used) for the duration of the COVID-19 declaration under Section 564(b)(1) of the Act, 21 U.S.C. section 360bbb-3(b)(1), unless the authorization is terminated or revoked.  Performed at [facility], [address] [place], [place] [address]          Radiology Studies: CT HEAD WO CONTRAST  ( )  Result Date: 03/07/2021 CLINICAL DATA:  Mental status changes. EXAM: CT HEAD WITHOUT CONTRAST TECHNIQUE: Contiguous axial images were obtained from the base of the skull through the vertex without intravenous contrast. COMPARISON:  None. FINDINGS: Brain: Moderate low density in the periventricular white matter likely related to small vessel disease. Expected cerebral and cerebellar atrophy for age. No hemorrhage, hydrocephalus, intra-axial, or extra-axial fluid collection. Hypoattenuation within the left occipital region including on 14/3, 37/6 and 64/5. Vascular: No hyperdense vessel or unexpected calcification. Advanced intracranial atherosclerosis. Skull: Normal Sinuses/Orbits: Normal imaged portions of the orbits and globes. Left ethmoid air cell and sphenoid sinus mucosal thickening. Clear mastoid air cells. Cerumen in the external ear canals. Other: None. IMPRESSION: Left occipital lobe hypoattenuation is suspicious for ischemia  of indeterminate acuity. Correlate with localizing symptoms. Otherwise, no acute intracranial abnormality. Cerebral atrophy and small vessel ischemic change. Sinus disease. Electronically Signed   By: Jeronimo Greaves M.D.   On: 03/07/2021 18:55   MR BRAIN WO CONTRAST  Result Date: 03/08/2021 CLINICAL DATA:  Delirium EXAM: MRI HEAD WITHOUT CONTRAST TECHNIQUE: Multiplanar, multiecho pulse sequences of the brain and surrounding structures were obtained without intravenous contrast. COMPARISON:  Head CT from yesterday FINDINGS: Brain: No acute infarction, hemorrhage, hydrocephalus, extra-axial collection or mass lesion. The small left occipital cortex infarct highlighted on prior head CT is chronic. Extensive chronic small vessel ischemia in the hemispheric white matter. Small remote left cerebellar infarct. Remote right basal ganglia perforator infarct. Generalized brain atrophy. Vascular: Preserved major flow voids Skull and upper cervical spine: Normal marrow signal  Sinuses/Orbits: Partially opacified left sphenoid sinus and posterior ethmoid air cells. Nasopharyngeal retention cysts. IMPRESSION: 1. No acute or subacute insult. 2. Brain atrophy and extensive chronic small vessel ischemia. Electronically Signed   By: Marnee Spring M.D.   On: 03/08/2021 04:54   DG Chest Port 1 View  Result Date: 03/07/2021 CLINICAL DATA:  Chest pain, hypertension EXAM: PORTABLE CHEST 1 VIEW COMPARISON:  None. FINDINGS: Small left pleural effusion is present with associated left basilar atelectasis or infiltrate. The lungs are otherwise clear. No pneumothorax. No pleural effusion on the right. Cardiac size is mildly enlarged. There is central pulmonary vascular congestion without overt pulmonary edema in keeping with changes of early cardiogenic failure or mild volume overload. Atherosclerotic calcifications seen within the thoracic aorta. No acute bone abnormality. IMPRESSION: Small left pleural effusion with associated left basilar atelectasis or infiltrate. Mild cardiomegaly. Central pulmonary vascular congestion in keeping with early cardiogenic failure or mild volume overload. Electronically Signed   By: Helyn Numbers MD   On: 03/07/2021 19:45   ECHOCARDIOGRAM COMPLETE  Result Date: 03/08/2021    ECHOCARDIOGRAM REPORT   Patient Name:   JATERRIUS RICKETSON Date of Exam: 03/08/2021 Medical Rec #:  [id number]    Height:       70.0 in Accession #:    [id number]   Weight:       152.3 lb Date of Birth:  February 28, 1930     BSA:          1.859 m Patient Age:    89 years     BP:           136/81 mmHg Patient Gender: M            HR:           46 bpm. Exam Location:  Inpatient Procedure: 2D Echo, Cardiac Doppler and Color Doppler Indications:    CHF  History:        Patient has prior history of Echocardiogram examinations, most                 recent 11/29/2019. CAD, Arrythmias:Atrial Fibrillation; Risk                 Factors:Diabetes and Hypertension.  Sonographer:    Eulah Pont RDCS Referring Phys:  [id number] Carlton Adam  Sonographer Comments: Image acquisition challenging due to respiratory motion. IMPRESSIONS  1. Left ventricular ejection fraction, by estimation, is 55 to 60%. The left ventricle has normal function. The left ventricle has no regional wall motion abnormalities. There is mild left ventricular hypertrophy. Left ventricular diastolic parameters are consistent with Grade II diastolic dysfunction (pseudonormalization).  2. Right ventricular systolic function is normal.  The right ventricular size is moderately enlarged. There is moderately elevated pulmonary artery systolic pressure. The estimated right ventricular systolic pressure is 45.2 mmHg.  3. Left atrial size was moderately dilated.  4. Right atrial size was moderately dilated.  5. The mitral valve is normal in structure. Moderate mitral valve regurgitation. No evidence of mitral stenosis. Moderate mitral annular calcification.  6. Functional bicuspid. The aortic valve is calcified. There is severe calcifcation of the aortic valve. There is severe thickening of the aortic valve. Aortic valve regurgitation is mild. Moderate aortic valve stenosis. Aortic valve area, by VTI measures 1.13 cm. Aortic valve mean gradient measures 22.0 mmHg. Aortic valve Vmax measures 2.94 m/s.  7. Pulmonic valve regurgitation is moderate.  8. The inferior vena cava is dilated in size with >50% respiratory variability, suggesting right atrial pressure of 8 mmHg. FINDINGS  Left Ventricle: Left ventricular ejection fraction, by estimation, is 55 to 60%. The left ventricle has normal function. The left ventricle has no regional wall motion abnormalities. The left ventricular internal cavity size was normal in size. There is  mild left ventricular hypertrophy. Left ventricular diastolic parameters are consistent with Grade II diastolic dysfunction (pseudonormalization).  LV Wall Scoring: The inferior wall is hypokinetic. Right Ventricle: The right ventricular  size is moderately enlarged. No increase in right ventricular wall thickness. Right ventricular systolic function is normal. There is moderately elevated pulmonary artery systolic pressure. The tricuspid regurgitant  velocity is 3.05 m/s, and with an assumed right atrial pressure of 8 mmHg, the estimated right ventricular systolic pressure is 45.2 mmHg. Left Atrium: Left atrial size was moderately dilated. Right Atrium: Right atrial size was moderately dilated. Pericardium: There is no evidence of pericardial effusion. Mitral Valve: The mitral valve is normal in structure. There is mild thickening of the mitral valve leaflet(s). There is mild calcification of the mitral valve leaflet(s). Moderate mitral annular calcification. Moderate mitral valve regurgitation. No evidence of mitral valve stenosis. Tricuspid Valve: The tricuspid valve is normal in structure. Tricuspid valve regurgitation is mild . No evidence of tricuspid stenosis. Aortic Valve: Functional bicuspid. The aortic valve is calcified. There is severe calcifcation of the aortic valve. There is severe thickening of the aortic valve. Aortic valve regurgitation is mild. Moderate aortic stenosis is present. Aortic valve mean  gradient measures 22.0 mmHg. Aortic valve peak gradient measures 34.7 mmHg. Aortic valve area, by VTI measures 1.13 cm. Pulmonic Valve: The pulmonic valve was normal in structure. Pulmonic valve regurgitation is moderate. No evidence of pulmonic stenosis. Aorta: The aortic root is normal in size and structure. Venous: The inferior vena cava is dilated in size with greater than 50% respiratory variability, suggesting right atrial pressure of 8 mmHg. IAS/Shunts: No atrial level shunt detected by color flow Doppler.  LEFT VENTRICLE PLAX 2D LVIDd:         4.70 cm  Diastology LVIDs:         2.70 cm  LV e' medial:    4.06 cm/s LV PW:         1.40 cm  LV E/e' medial:  23.3 LV IVS:        1.10 cm  LV e' lateral:   6.12 cm/s LVOT diam:     2.10  cm  LV E/e' lateral: 15.5 LV SV:         72 LV SV Index:   39 LVOT Area:     3.46 cm  RIGHT VENTRICLE RV S prime:     9.81 cm/s  TAPSE (M-mode): 1.3 cm LEFT ATRIUM             Index       RIGHT ATRIUM           Index LA diam:        3.70 cm 1.99 cm/m  RA Area:     25.00 cm LA Vol (A2C):   77.6 ml 41.74 ml/m RA Volume:   70.30 ml  37.81 ml/m LA Vol (A4C):   75.1 ml 40.39 ml/m LA Biplane Vol: 76.6 ml 41.20 ml/m  AORTIC VALVE AV Area (Vmax):    1.26 cm AV Area (Vmean):   1.03 cm AV Area (VTI):     1.13 cm AV Vmax:           294.33 cm/s AV Vmean:          222.667 cm/s AV VTI:            0.641 m AV Peak Grad:      34.7 mmHg AV Mean Grad:      22.0 mmHg LVOT Vmax:         107.00 cm/s LVOT Vmean:        66.333 cm/s LVOT VTI:          0.208 m LVOT/AV VTI ratio: 0.32  AORTA Ao Root diam: 3.80 cm Ao Asc diam:  3.80 cm MITRAL VALVE               TRICUSPID VALVE MV Area (PHT): 3.72 cm    TR Peak grad:   37.2 mmHg MV Decel Time: 204 msec    TR Vmax:        305.00 cm/s MV E velocity: 94.70 cm/s MV A velocity: 48.80 cm/s  SHUNTS MV E/A ratio:  1.94        Systemic VTI:  0.21 m                            Systemic Diam: 2.10 cm Donato Schultz MD Electronically signed by Donato Schultz MD Signature Date/Time: 03/08/2021/11:33:28 AM    Final         Scheduled Meds:  donepezil  10 mg Oral q1600   hydrALAZINE  25 mg Oral BID   insulin aspart  0-5 Units Subcutaneous QHS   insulin aspart  0-9 Units Subcutaneous TID WC   lisinopril  40 mg Oral q1600   potassium chloride  40 mEq Oral Once   sodium chloride flush  3 mL Intravenous Q12H   Continuous Infusions:  sodium chloride       LOS: 2 days   Time spent= 35 mins    Lanaiya Lantry Joline Maxcy, MD [facility]  If 7PM-7AM, please contact night-coverage  03/09/2021, 7:38 AM

## 2021-03-09 NOTE — Plan of Care (Signed)
  Problem: Education: Goal: Knowledge of General Education information will improve Description: Including pain rating scale, medication(s)/side effects and non-pharmacologic comfort measures Outcome: Progressing   Problem: Health Behavior/Discharge Planning: Goal: Ability to manage health-related needs will improve Outcome: Progressing   Problem: Clinical Measurements: Goal: Ability to maintain clinical measurements within normal limits will improve Outcome: Progressing Goal: Will remain free from infection Outcome: Progressing Goal: Diagnostic test results will improve Outcome: Progressing Goal: Respiratory complications will improve Outcome: Progressing Goal: Cardiovascular complication will be avoided Outcome: Progressing   Problem: Activity: Goal: Risk for activity intolerance will decrease Outcome: Progressing   Problem: Nutrition: Goal: Adequate nutrition will be maintained Outcome: Progressing   Problem: Coping: Goal: Level of anxiety will decrease Outcome: Progressing   Problem: Elimination: Goal: Will not experience complications related to bowel motility Outcome: Progressing Goal: Will not experience complications related to urinary retention Outcome: Progressing   Problem: Pain Managment: Goal: General experience of comfort will improve Outcome: Progressing   Problem: Safety: Goal: Ability to remain free from injury will improve Outcome: Progressing   Problem: Skin Integrity: Goal: Risk for impaired skin integrity will decrease Outcome: Progressing   Problem: Education: Goal: Ability to demonstrate management of disease process will improve Outcome: Progressing Goal: Ability to verbalize understanding of medication therapies will improve Outcome: Progressing Goal: Individualized Educational Video(s) Outcome: Progressing   Problem: Activity: Goal: Capacity to carry out activities will improve Outcome: Progressing   Problem: Cardiac: Goal:  Ability to achieve and maintain adequate cardiopulmonary perfusion will improve Outcome: Progressing   Problem: Education: Goal: Knowledge of disease or condition will improve Outcome: Progressing Goal: Knowledge of secondary prevention will improve Outcome: Progressing Goal: Knowledge of patient specific risk factors addressed and post discharge goals established will improve Outcome: Progressing Goal: Individualized Educational Video(s) Outcome: Progressing   Problem: Ischemic Stroke/TIA Tissue Perfusion: Goal: Complications of ischemic stroke/TIA will be minimized Outcome: Progressing

## 2021-03-10 LAB — CBC
HCT: 40.2 % (ref 39.0–52.0)
Hemoglobin: 13.8 g/dL (ref 13.0–17.0)
MCH: 32 pg (ref 26.0–34.0)
MCHC: 34.3 g/dL (ref 30.0–36.0)
MCV: 93.3 fL (ref 80.0–100.0)
Platelets: 152 10*3/uL (ref 150–400)
RBC: 4.31 MIL/uL (ref 4.22–5.81)
RDW: 14.1 % (ref 11.5–15.5)
WBC: 8.6 10*3/uL (ref 4.0–10.5)
nRBC: 0 % (ref 0.0–0.2)

## 2021-03-10 LAB — GLUCOSE, CAPILLARY
Glucose-Capillary: 149 mg/dL — ABNORMAL HIGH (ref 70–99)
Glucose-Capillary: 176 mg/dL — ABNORMAL HIGH (ref 70–99)

## 2021-03-10 LAB — BASIC METABOLIC PANEL
Anion gap: 7 (ref 5–15)
BUN: 31 mg/dL — ABNORMAL HIGH (ref 8–23)
CO2: 25 mmol/L (ref 22–32)
Calcium: 8.7 mg/dL — ABNORMAL LOW (ref 8.9–10.3)
Chloride: 106 mmol/L (ref 98–111)
Creatinine, Ser: 1.42 mg/dL — ABNORMAL HIGH (ref 0.61–1.24)
GFR, Estimated: 47 mL/min — ABNORMAL LOW (ref >60)
Glucose, Bld: 148 mg/dL — ABNORMAL HIGH (ref 70–99)
Potassium: 3.4 mmol/L — ABNORMAL LOW (ref 3.5–5.1)
Sodium: 138 mmol/L (ref 135–145)

## 2021-03-10 LAB — MAGNESIUM: Magnesium: 1.9 mg/dL (ref 1.7–2.4)

## 2021-03-10 MED ORDER — HYDRALAZINE HCL 25 MG PO TABS
25.0000 mg | ORAL_TABLET | Freq: Three times a day (TID) | ORAL | Status: DC
  Administered 2021-03-10: 25 mg via ORAL
  Filled 2021-03-10: qty 1

## 2021-03-10 MED ORDER — POTASSIUM CHLORIDE CRYS ER 20 MEQ PO TBCR
40.0000 meq | EXTENDED_RELEASE_TABLET | Freq: Once | ORAL | Status: AC
  Administered 2021-03-10: 40 meq via ORAL
  Filled 2021-03-10: qty 2

## 2021-03-10 NOTE — TOC Transition Note (Signed)
Transition of Care [facility]) - CM/SW Discharge Note   Patient Details  Name: Peter Martin MRN: [id number] Date of Birth: 22-Nov-1929  [facility] CM/SW Contact:  Leone Haven, RN Phone Number: 03/10/2021, 11:38 AM   Clinical Narrative:    NCM offered choice for HHPT, wife states they do not have a preference.  NCM made referral to [facility] with [facility].  He can take referral soc will begin 24 to 48  hrs post dc.  Wife is ok with [employer] supplying the 3 n 1.  NCM made referral to [facility] with [facility] ,the 3 n 1 will be brought up to patient room prior to dc.    Final next level of care: Home w Home Health Services Barriers to Discharge: No Barriers Identified   Patient Goals and CMS Choice Patient states their goals for this hospitalization and ongoing recovery are:: return home with [facility] CMS Medicare.gov Compare Post Acute Care list provided to:: Patient Represenative (must comment) Choice offered to / list presented to : Spouse  Discharge Placement                       Discharge Plan and Services                DME Arranged: 3-N-1 DME Agency: [facility] Date DME Agency Contacted: 03/10/21 Time DME Agency Contacted: [phone] Representative spoke with at DME Agency: Silvio Pate HH Arranged: PT HH Agency: [facility] Date [facility] Agency Contacted: 03/10/21 Time HH Agency Contacted: [phone] Representative spoke with at [facility] Agency: Kandee Keen  Social Determinants of Health (SDOH) Interventions     Readmission Risk Interventions No flowsheet data found.

## 2021-03-10 NOTE — Care Management Important Message (Signed)
Important Message  Patient Details  Name: Peter Martin MRN: [id number] Date of Birth: [birth year]   Medicare Important Message Given:  Yes     Jarad, Barth 03/10/2021, 9:39 AM

## 2021-03-10 NOTE — Progress Notes (Signed)
Physical Therapy Treatment Patient Details Name: Peter Martin MRN: [id number] DOB: [birth year] Today's Date: 03/10/2021    History of Present Illness Pt is an 87 y.o. male admitted 03/07/21 with chest pain, SOB, and elevated BP. Likely secondary to acute CHF. MRI showed no acute findings. PMH includes dementia, HTN, CAD, DM, CKD3, PAF.   PT Comments    Pt progressing well with mobility. Today's session focused on transfer and gait training with rollator, pt requiring intermittent min guard for balance. Pt's daughter present and supportive; receptive to education. Pt preparing for d/c home today. If to remain admitted, will continue to follow acutely.  SpO2 96% on RA    Follow Up Recommendations  Home health PT;Supervision/Assistance - 24 hour     Equipment Recommendations  3in1 (PT)    Recommendations for Other Services       Precautions / Restrictions Precautions Precautions: Fall Restrictions Weight Bearing Restrictions: No    Mobility  Bed Mobility Overal bed mobility: Modified Independent Bed Mobility: Supine to Sit           General bed mobility comments: Increased time requiring cues to complete task    Transfers Overall transfer level: Needs assistance Equipment used: 4-wheeled walker;None Transfers: Sit to/from Stand Sit to Stand: Min guard         General transfer comment: Educ on locking rollator brakes prior to transfer; multiple sit<>stands from EOB and BSC (over toilet) with min guard for balance; heavy reliance on UE support to push into standing  Ambulation/Gait Ambulation/Gait assistance: Min guard Gait Distance (Feet): 160 Feet Assistive device: 4-wheeled walker;1 person hand held assist Gait Pattern/deviations: Step-through pattern;Decreased stride length;Shuffle;Trunk flexed     General Gait Details: Slow, shuffling gait with rollator and intermittent min guard for balance; verbal cues to 'pick up feet' and increase step length, pt able to  intermittently correct; additonal gait trial withotu DME, reliant on minA for HHA to balance, also reaching to furniture for additional UE support   Stairs             Wheelchair Mobility    Modified Rankin (Stroke Patients Only)       Balance Overall balance assessment: Needs assistance Sitting-balance support: Feet supported Sitting balance-Leahy Scale: Fair     Standing balance support: Bilateral upper extremity supported;During functional activity;No upper extremity supported Standing balance-Leahy Scale: Fair Standing balance comment: can static stand without UE support, unable to accept challenge; static and dynamic stability improved with UE support                            Cognition Arousal/Alertness: Awake/alert Behavior During Therapy: WFL for tasks assessed/performed Overall Cognitive Status: History of cognitive impairments - at baseline                                 General Comments: H/o dementia; following majority of simple commands with increased time      Exercises      General Comments General comments (skin integrity, edema, etc.): Daughter present and supportive; educ re: activity recommendations, DME recs, fall risk reduction, importance of mobility      Pertinent Vitals/Pain Pain Assessment: No/denies pain    Home Living                      Prior Function  PT Goals (current goals can now be found in the care plan section)      Frequency    Min 3X/week      PT Plan Current plan remains appropriate    Co-evaluation              AM-PAC PT "6 Clicks" Mobility   Outcome Measure  Help needed turning from your back to your side while in a flat bed without using bedrails?: None Help needed moving from lying on your back to sitting on the side of a flat bed without using bedrails?: A Little Help needed moving to and from a bed to a chair (including a wheelchair)?: A  Little Help needed standing up from a chair using your arms (e.g., wheelchair or bedside chair)?: A Little Help needed to walk in hospital room?: A Little Help needed climbing 3-5 steps with a railing? : A Little 6 Click Score: 19    End of Session   Activity Tolerance: Patient tolerated treatment well Patient left: in chair;with call bell/phone within reach;with chair alarm set;with family/visitor present Nurse Communication: Mobility status PT Visit Diagnosis: Muscle weakness (generalized) (M62.81);Difficulty in walking, not elsewhere classified (R26.2)     Time: 1010-1033 PT Time Calculation (min) (ACUTE ONLY): 23 min  Charges:  $Gait Training: 8-22 mins $Therapeutic Activity: 8-22 mins                     Peter Martin, PT, DPT Acute Rehabilitation Services  Pager [phone] Office [phone]  Peter Martin 03/10/2021, 11:57 AM

## 2021-03-10 NOTE — Discharge Summary (Signed)
Physician Discharge Summary  Peter Martin GYI:[id number] DOB: [birth year] DOA: 03/07/2021  PCP: Kaleen Mask, MD  Admit date: 03/07/2021 Discharge date: 03/10/2021  Admitted From: Home Disposition: Home  Recommendations for Outpatient Follow-up:  Follow up with PCP in 1-2 weeks Please obtain BMP/CBC in one week your next doctors visit.  Lisinopril 40 mg daily, hydralazine 25 mg 3 times daily.  Goal blood pressure systolic should range from 130-150. Liberal control  Advised to keep blood pressure log, follow-up outpatient PCP.   Discharge Condition: Stable CODE STATUS: DNR Diet recommendation: Heart healthy  Brief/Interim Summary: 87 y.o. male with medical history significant for HTN, DMT2 diet controlled, PAF, esophageal stricture, CKD 3, dementia, CAD who presents for evaluation of chest pain intermittently associated with some SOB. Has had confusion since last night. He has a 24 hour caregiver who lives with him. He has been having high BP readings the past few days.  Recently his Norvasc was changed to hydralazine due to lower extremity swelling.  Upon admission CT head showed left occipital lobe age-indeterminate infarct.  MRI brain was negative for any acute pathology but did show chronic ischemic changes.  Also concerned for CHF exacerbation and hypertensive urgency therefore admitted to the hospital.  He was gently diuresed during the hospitalization, blood pressure was controlled with lisinopril 40 mg daily and hydralazine 25 mg was slowly titrated up.  Patient and family was advised against very strict blood pressure control due to concerns of hypoperfusion leading to TIA/CVA.  Slightly higher than normal blood pressure is acceptable. PT recommended home health therefore arrangements were made.  Had an extensive discussion with the patient's daughter regarding his care.  All the questions were answered.     Assessment & Plan:   Principal Problem:   Acute CHF (congestive heart  failure) (HCC) Active Problems:   AF (paroxysmal atrial fibrillation) (HCC)   CKD (chronic kidney disease) stage 3, GFR 30-59 ml/min (HCC)   Hypertensive urgency   Confusion   Abnormal CT of the head   Diabetes mellitus type 2 in nonobese (HCC)   Heart murmur   Pressure injury of skin     Acute CHF (congestive heart failure), resolved -Currently patient is on telemetry.  Elevated BNP - Troponins remain flat.  EKG does not show any acute ischemic changes - Echocardiogram-.  EF 55%, grade 2 DD, elevated PA pressures, dilated cardiomyopathy. Moderate AS    Hypertensive urgency, resolved History of essential hypertension -CTA chills chronic extensive small vessel disease confirmed on MRI in -Continue lisinopril 40 mg daily.  Hydralazine 25 mg 3 times daily.  Slowly adjust outpatient as necessary   Paroxysmal AF (paroxysmal atrial fibrillation) -Not on long-term anticoagulation due to risk of falls - Rate is currently controlled     Diabetes mellitus type 2 in nonobese -Appears to be diet controlled.  Hemoglobin A1c 6.4.     CKD (chronic kidney disease) stage 3 A -Creatinine around baseline of 1.3.  Closely monitor     Confusion with history of dementia -Suspect secondary to chronic brain changes from infarcts.  No signs of infection.  UA is negative.  I suspect he has some form of vascular dementia. - TSH, B12-normal - Daily Aricept   Spoke with the patient and family regarding goals of care conversation.  Patient is DNR/DNI     Body mass index is 21 kg/m.  Pressure Injury 03/08/21 Coccyx Mid Stage 1 -  Intact skin with non-blanchable redness of a localized area usually over a  bony prominence. (Active)  03/08/21 0113  Location: Coccyx  Location Orientation: Mid  Staging: Stage 1 -  Intact skin with non-blanchable redness of a localized area usually over a bony prominence.  Wound Description (Comments):   Present on Admission: Yes        Discharge Diagnoses:   Principal Problem:   Acute CHF (congestive heart failure) (HCC) Active Problems:   AF (paroxysmal atrial fibrillation) (HCC)   CKD (chronic kidney disease) stage 3, GFR 30-59 ml/min (HCC)   Hypertensive urgency   Confusion   Abnormal CT of the head   Diabetes mellitus type 2 in nonobese (HCC)   Heart murmur   Pressure injury of skin     Subjective: Patient sitting up in the chair, no complaints today.  Feels very well today.  Good p.o. intake yesterday. Extensive discussion with the patient's daughter as well today.  Discharge Exam: Vitals:   03/10/21 0833 03/10/21 1113  BP: (!) 164/73 (!) 175/89  Pulse: 73 70  Resp:  20  Temp:  97.7 F (36.5 C)  SpO2: 98% 98%   Vitals:   03/10/21 0359 03/10/21 0800 03/10/21 0833 03/10/21 1113  BP: (!) 186/82 (!) 193/87 (!) 164/73 (!) 175/89  Pulse: 61 (!) 56 73 70  Resp: 20   20  Temp: (!) 97.4 F (36.3 C)   97.7 F (36.5 C)  TempSrc: Oral   Oral  SpO2: 94%  98% 98%  Weight:      Height:        General: Pt is alert, awake, not in acute distress Cardiovascular: RRR, S1/S2 +, no rubs, no gallops Respiratory: CTA bilaterally, no wheezing, no rhonchi Abdominal: Soft, NT, ND, bowel sounds + Extremities: no edema, no cyanosis  Discharge Instructions   Allergies as of 03/10/2021   No Known Allergies      Medication List     TAKE these medications    acetaminophen 500 MG tablet Commonly known as: TYLENOL Take 500 mg by mouth in the morning and at bedtime.   donepezil 10 MG tablet Commonly known as: ARICEPT Take 10 mg by mouth daily in the afternoon.   hydrALAZINE 25 MG tablet Commonly known as: APRESOLINE Take 1 tablet (25 mg total) by mouth 3 (three) times daily. What changed: when to take this   lisinopril 40 MG tablet Commonly known as: ZESTRIL Take 40 mg by mouth daily in the afternoon.               Durable Medical Equipment  (From admission, onward)           Start     Ordered   03/10/21  1045  For home use only DME 3 n 1  Once        03/10/21 1044            Follow-up Information     Kaleen Mask, MD. Go on 03/15/2021.   Specialty: Family Medicine Why: @10 :30am Contact information: [address] [place] [phone]         [facility] Follow up.   Specialty: Home Health Services Why: HHPT Contact information: [address] [place] [phone]         [facility] Follow up.   Why: 3 n 1 Contact information: [address] [place] [phone] [place] [phone]                No Known Allergies  You were  cared for by a hospitalist during your hospital stay. If you have any questions about your discharge medications or the care you received while you were in the hospital after you are discharged, you can call the unit and asked to speak with the hospitalist on call if the hospitalist that took care of you is not available. Once you are discharged, your primary care physician will handle any further medical issues. Please note that no refills for any discharge medications will be authorized once you are discharged, as it is imperative that you return to your primary care physician (or establish a relationship with a primary care physician if you do not have one) for your aftercare needs so that they can reassess your need for medications and monitor your lab values.   Procedures/Studies: CT HEAD WO CONTRAST ( )  Result Date: 03/07/2021 CLINICAL DATA:  Mental status changes. EXAM: CT HEAD WITHOUT CONTRAST TECHNIQUE: Contiguous axial images were obtained from the base of the skull through the vertex without intravenous contrast. COMPARISON:  None. FINDINGS: Brain: Moderate low density in the periventricular white matter likely related to small vessel disease. Expected cerebral and cerebellar atrophy for age. No hemorrhage, hydrocephalus, intra-axial, or extra-axial fluid  collection. Hypoattenuation within the left occipital region including on 14/3, 37/6 and 64/5. Vascular: No hyperdense vessel or unexpected calcification. Advanced intracranial atherosclerosis. Skull: Normal Sinuses/Orbits: Normal imaged portions of the orbits and globes. Left ethmoid air cell and sphenoid sinus mucosal thickening. Clear mastoid air cells. Cerumen in the external ear canals. Other: None. IMPRESSION: Left occipital lobe hypoattenuation is suspicious for ischemia of indeterminate acuity. Correlate with localizing symptoms. Otherwise, no acute intracranial abnormality. Cerebral atrophy and small vessel ischemic change. Sinus disease. Electronically Signed   By: Jeronimo Greaves M.D.   On: 03/07/2021 18:55   MR BRAIN WO CONTRAST  Result Date: 03/08/2021 CLINICAL DATA:  Delirium EXAM: MRI HEAD WITHOUT CONTRAST TECHNIQUE: Multiplanar, multiecho pulse sequences of the brain and surrounding structures were obtained without intravenous contrast. COMPARISON:  Head CT from yesterday FINDINGS: Brain: No acute infarction, hemorrhage, hydrocephalus, extra-axial collection or mass lesion. The small left occipital cortex infarct highlighted on prior head CT is chronic. Extensive chronic small vessel ischemia in the hemispheric white matter. Small remote left cerebellar infarct. Remote right basal ganglia perforator infarct. Generalized brain atrophy. Vascular: Preserved major flow voids Skull and upper cervical spine: Normal marrow signal Sinuses/Orbits: Partially opacified left sphenoid sinus and posterior ethmoid air cells. Nasopharyngeal retention cysts. IMPRESSION: 1. No acute or subacute insult. 2. Brain atrophy and extensive chronic small vessel ischemia. Electronically Signed   By: Marnee Spring M.D.   On: 03/08/2021 04:54   DG Chest Port 1 View  Result Date: 03/07/2021 CLINICAL DATA:  Chest pain, hypertension EXAM: PORTABLE CHEST 1 VIEW COMPARISON:  None. FINDINGS: Small left pleural effusion is present  with associated left basilar atelectasis or infiltrate. The lungs are otherwise clear. No pneumothorax. No pleural effusion on the right. Cardiac size is mildly enlarged. There is central pulmonary vascular congestion without overt pulmonary edema in keeping with changes of early cardiogenic failure or mild volume overload. Atherosclerotic calcifications seen within the thoracic aorta. No acute bone abnormality. IMPRESSION: Small left pleural effusion with associated left basilar atelectasis or infiltrate. Mild cardiomegaly. Central pulmonary vascular congestion in keeping with early cardiogenic failure or mild volume overload. Electronically Signed   By: Helyn Numbers MD   On: 03/07/2021 19:45   ECHOCARDIOGRAM COMPLETE  Result Date: 03/08/2021    ECHOCARDIOGRAM REPORT  Patient Name:   Peter Martin Date of Exam: 03/08/2021 Medical Rec #:  [id number]    Height:       70.0 in Accession #:    [id number]   Weight:       152.3 lb Date of Birth:  04/18/1930     BSA:          1.859 m Patient Age:    87 years     BP:           136/81 mmHg Patient Gender: M            HR:           46 bpm. Exam Location:  Inpatient Procedure: 2D Echo, Cardiac Doppler and Color Doppler Indications:    CHF  History:        Patient has prior history of Echocardiogram examinations, most                 recent 11/29/2019. CAD, Arrythmias:Atrial Fibrillation; Risk                 Factors:Diabetes and Hypertension.  Sonographer:    Eulah PontSarah Pirrotta RDCS Referring Phys: [id number] Carlton AdamBRADLEY S CHOTINER  Sonographer Comments: Image acquisition challenging due to respiratory motion. IMPRESSIONS  1. Left ventricular ejection fraction, by estimation, is 55 to 60%. The left ventricle has normal function. The left ventricle has no regional wall motion abnormalities. There is mild left ventricular hypertrophy. Left ventricular diastolic parameters are consistent with Grade II diastolic dysfunction (pseudonormalization).  2. Right ventricular systolic function is  normal. The right ventricular size is moderately enlarged. There is moderately elevated pulmonary artery systolic pressure. The estimated right ventricular systolic pressure is 45.2 mmHg.  3. Left atrial size was moderately dilated.  4. Right atrial size was moderately dilated.  5. The mitral valve is normal in structure. Moderate mitral valve regurgitation. No evidence of mitral stenosis. Moderate mitral annular calcification.  6. Functional bicuspid. The aortic valve is calcified. There is severe calcifcation of the aortic valve. There is severe thickening of the aortic valve. Aortic valve regurgitation is mild. Moderate aortic valve stenosis. Aortic valve area, by VTI measures 1.13 cm. Aortic valve mean gradient measures 22.0 mmHg. Aortic valve Vmax measures 2.94 m/s.  7. Pulmonic valve regurgitation is moderate.  8. The inferior vena cava is dilated in size with >50% respiratory variability, suggesting right atrial pressure of 8 mmHg. FINDINGS  Left Ventricle: Left ventricular ejection fraction, by estimation, is 55 to 60%. The left ventricle has normal function. The left ventricle has no regional wall motion abnormalities. The left ventricular internal cavity size was normal in size. There is  mild left ventricular hypertrophy. Left ventricular diastolic parameters are consistent with Grade II diastolic dysfunction (pseudonormalization).  LV Wall Scoring: The inferior wall is hypokinetic. Right Ventricle: The right ventricular size is moderately enlarged. No increase in right ventricular wall thickness. Right ventricular systolic function is normal. There is moderately elevated pulmonary artery systolic pressure. The tricuspid regurgitant  velocity is 3.05 m/s, and with an assumed right atrial pressure of 8 mmHg, the estimated right ventricular systolic pressure is 45.2 mmHg. Left Atrium: Left atrial size was moderately dilated. Right Atrium: Right atrial size was moderately dilated. Pericardium: There is no  evidence of pericardial effusion. Mitral Valve: The mitral valve is normal in structure. There is mild thickening of the mitral valve leaflet(s). There is mild calcification of the mitral valve leaflet(s). Moderate mitral annular calcification. Moderate mitral valve regurgitation. No  evidence of mitral valve stenosis. Tricuspid Valve: The tricuspid valve is normal in structure. Tricuspid valve regurgitation is mild . No evidence of tricuspid stenosis. Aortic Valve: Functional bicuspid. The aortic valve is calcified. There is severe calcifcation of the aortic valve. There is severe thickening of the aortic valve. Aortic valve regurgitation is mild. Moderate aortic stenosis is present. Aortic valve mean  gradient measures 22.0 mmHg. Aortic valve peak gradient measures 34.7 mmHg. Aortic valve area, by VTI measures 1.13 cm. Pulmonic Valve: The pulmonic valve was normal in structure. Pulmonic valve regurgitation is moderate. No evidence of pulmonic stenosis. Aorta: The aortic root is normal in size and structure. Venous: The inferior vena cava is dilated in size with greater than 50% respiratory variability, suggesting right atrial pressure of 8 mmHg. IAS/Shunts: No atrial level shunt detected by color flow Doppler.  LEFT VENTRICLE PLAX 2D LVIDd:         4.70 cm  Diastology LVIDs:         2.70 cm  LV e' medial:    4.06 cm/s LV PW:         1.40 cm  LV E/e' medial:  23.3 LV IVS:        1.10 cm  LV e' lateral:   6.12 cm/s LVOT diam:     2.10 cm  LV E/e' lateral: 15.5 LV SV:         72 LV SV Index:   39 LVOT Area:     3.46 cm  RIGHT VENTRICLE RV S prime:     9.81 cm/s TAPSE (M-mode): 1.3 cm LEFT ATRIUM             Index       RIGHT ATRIUM           Index LA diam:        3.70 cm 1.99 cm/m  RA Area:     25.00 cm LA Vol (A2C):   77.6 ml 41.74 ml/m RA Volume:   70.30 ml  37.81 ml/m LA Vol (A4C):   75.1 ml 40.39 ml/m LA Biplane Vol: 76.6 ml 41.20 ml/m  AORTIC VALVE AV Area (Vmax):    1.26 cm AV Area (Vmean):   1.03 cm  AV Area (VTI):     1.13 cm AV Vmax:           294.33 cm/s AV Vmean:          222.667 cm/s AV VTI:            0.641 m AV Peak Grad:      34.7 mmHg AV Mean Grad:      22.0 mmHg LVOT Vmax:         107.00 cm/s LVOT Vmean:        66.333 cm/s LVOT VTI:          0.208 m LVOT/AV VTI ratio: 0.32  AORTA Ao Root diam: 3.80 cm Ao Asc diam:  3.80 cm MITRAL VALVE               TRICUSPID VALVE MV Area (PHT): 3.72 cm    TR Peak grad:   37.2 mmHg MV Decel Time: 204 msec    TR Vmax:        305.00 cm/s MV E velocity: 94.70 cm/s MV A velocity: 48.80 cm/s  SHUNTS MV E/A ratio:  1.94        Systemic VTI:  0.21 m  Systemic Diam: 2.10 cm Donato Schultz MD Electronically signed by Donato Schultz MD Signature Date/Time: 03/08/2021/11:33:28 AM    Final      The results of significant diagnostics from this hospitalization (including imaging, microbiology, ancillary and laboratory) are listed below for reference.     Microbiology: Recent Results (from the past 240 hour(s))  Resp Panel by RT-PCR (Flu A&B, Covid) Nasopharyngeal Swab     Status: None   Collection Time: 03/07/21  7:23 PM   Specimen: Nasopharyngeal Swab; Nasopharyngeal(NP) swabs in vial transport medium  Result Value Ref Range Status   SARS Coronavirus 2 by RT PCR NEGATIVE NEGATIVE Final    Comment: (NOTE) SARS-CoV-2 target nucleic acids are NOT DETECTED.  The SARS-CoV-2 RNA is generally detectable in upper respiratory specimens during the acute phase of infection. The lowest concentration of SARS-CoV-2 viral copies this assay can detect is 138 copies/mL. A negative result does not preclude SARS-Cov-2 infection and should not be used as the sole basis for treatment or other patient management decisions. A negative result may occur with  improper specimen collection/handling, submission of specimen other than nasopharyngeal swab, presence of viral mutation(s) within the areas targeted by this assay, and inadequate number of  viral copies(<138 copies/mL). A negative result must be combined with clinical observations, patient history, and epidemiological information. The expected result is Negative.  Fact Sheet for Patients:  BloggerCourse.com  Fact Sheet for Healthcare Providers:  SeriousBroker.it  This test is no t yet approved or cleared by the [place] FDA and  has been authorized for detection and/or diagnosis of SARS-CoV-2 by FDA under an Emergency Use Authorization (EUA). This EUA will remain  in effect (meaning this test can be used) for the duration of the COVID-19 declaration under Section 564(b)(1) of the Act, 21 U.S.C.section 360bbb-3(b)(1), unless the authorization is terminated  or revoked sooner.       Influenza A by PCR NEGATIVE NEGATIVE Final   Influenza B by PCR NEGATIVE NEGATIVE Final    Comment: (NOTE) The Xpert Xpress SARS-CoV-2/FLU/RSV plus assay is intended as an aid in the diagnosis of influenza from Nasopharyngeal swab specimens and should not be used as a sole basis for treatment. Nasal washings and aspirates are unacceptable for Xpert Xpress SARS-CoV-2/FLU/RSV testing.  Fact Sheet for Patients: BloggerCourse.com  Fact Sheet for Healthcare Providers: SeriousBroker.it  This test is not yet approved or cleared by the [place] FDA and has been authorized for detection and/or diagnosis of SARS-CoV-2 by FDA under an Emergency Use Authorization (EUA). This EUA will remain in effect (meaning this test can be used) for the duration of the COVID-19 declaration under Section 564(b)(1) of the Act, 21 U.S.C. section 360bbb-3(b)(1), unless the authorization is terminated or revoked.  Performed at [facility], [address] [place], [place] [address]   Urine Culture     Status: None   Collection Time: 03/08/21 12:55 AM   Specimen: Urine, Clean Catch  Result Value  Ref Range Status   Specimen Description URINE, CLEAN CATCH  Final   Special Requests NONE  Final   Culture   Final    NO GROWTH Performed at [facility], [address] [place], [place] [address]    Report Status 03/09/2021 FINAL  Final     Labs: BNP (last 3 results) Recent Labs    03/07/21 2044  BNP 2,477.4*   Basic Metabolic Panel: Recent Labs  Lab 03/07/21 1741 03/08/21 0009 03/09/21 0343 03/10/21 0332  NA 133*  135 133* 138  K 3.8 4.5 3.6 3.4*  CL 103 104 98 106  CO2 21* GLUCOSE 205* 188* 149* 148*  BUN 22 22 29* 31*  CREATININE 1.27* 1.34* 1.44* 1.42*  CALCIUM 9.1 9.1 8.8* 8.7*  MG  --   --  1.9 1.9   Liver Function Tests: Recent Labs  Lab 03/07/21 1741  AST 92*  ALT 90*  ALKPHOS 157*  BILITOT 3.0*  PROT 7.5  ALBUMIN 3.4*   No results for input(s): LIPASE, AMYLASE in the last 168 hours. No results for input(s): AMMONIA in the last 168 hours. CBC: Recent Labs  Lab 03/07/21 1741 03/08/21 0009 03/08/21 0851 03/09/21 0343 03/10/21 0332  WBC 9.1 10.0  --  9.5 8.6  NEUTROABS 7.3  --   --   --   --   HGB 15.3 14.4  --  13.3 13.8  HCT 45.3 42.8 38.4 39.6 40.2  MCV 96.0 94.3  --  95.4 93.3  PLT 128* 137*  --  122* 152   Cardiac Enzymes: No results for input(s): CKTOTAL, CKMB, CKMBINDEX, TROPONINI in the last 168 hours. BNP: Invalid input(s): POCBNP CBG: Recent Labs  Lab 03/09/21 1125 03/09/21 1712 03/09/21 2107 03/10/21 0559 03/10/21 1209  GLUCAP 182* 158* 153* 149* 176*   D-Dimer No results for input(s): DDIMER in the last 72 hours. Hgb A1c Recent Labs    03/08/21 0009  HGBA1C 6.4*   Lipid Profile No results for input(s): CHOL, HDL, LDLCALC, TRIG, CHOLHDL, LDLDIRECT in the last 72 hours. Thyroid function studies Recent Labs    03/08/21 0851  TSH 2.201   Anemia work up Recent Labs    03/08/21 0851  VITAMINB12 300   Urinalysis    Component Value Date/Time   COLORURINE YELLOW 03/08/2021 0055    APPEARANCEUR CLEAR 03/08/2021 0055   LABSPEC 1.008 03/08/2021 0055   PHURINE 5.0 03/08/2021 0055   GLUCOSEU NEGATIVE 03/08/2021 0055   HGBUR NEGATIVE 03/08/2021 0055   BILIRUBINUR NEGATIVE 03/08/2021 0055   KETONESUR NEGATIVE 03/08/2021 0055   PROTEINUR NEGATIVE 03/08/2021 0055   NITRITE NEGATIVE 03/08/2021 0055   LEUKOCYTESUR NEGATIVE 03/08/2021 0055   Sepsis Labs Invalid input(s): PROCALCITONIN,  WBC,  LACTICIDVEN Microbiology Recent Results (from the past 240 hour(s))  Resp Panel by RT-PCR (Flu A&B, Covid) Nasopharyngeal Swab     Status: None   Collection Time: 03/07/21  7:23 PM   Specimen: Nasopharyngeal Swab; Nasopharyngeal(NP) swabs in vial transport medium  Result Value Ref Range Status   SARS Coronavirus 2 by RT PCR NEGATIVE NEGATIVE Final    Comment: (NOTE) SARS-CoV-2 target nucleic acids are NOT DETECTED.  The SARS-CoV-2 RNA is generally detectable in upper respiratory specimens during the acute phase of infection. The lowest concentration of SARS-CoV-2 viral copies this assay can detect is 138 copies/mL. A negative result does not preclude SARS-Cov-2 infection and should not be used as the sole basis for treatment or other patient management decisions. A negative result may occur with  improper specimen collection/handling, submission of specimen other than nasopharyngeal swab, presence of viral mutation(s) within the areas targeted by this assay, and inadequate number of viral copies(<138 copies/mL). A negative result must be combined with clinical observations, patient history, and epidemiological information. The expected result is Negative.  Fact Sheet for Patients:  BloggerCourse.com  Fact Sheet for Healthcare Providers:  SeriousBroker.it  This test is no t yet approved or cleared by the [place] FDA and  has been authorized for detection  and/or diagnosis of SARS-CoV-2 by FDA under an Emergency Use  Authorization (EUA). This EUA will remain  in effect (meaning this test can be used) for the duration of the COVID-19 declaration under Section 564(b)(1) of the Act, 21 U.S.C.section 360bbb-3(b)(1), unless the authorization is terminated  or revoked sooner.       Influenza A by PCR NEGATIVE NEGATIVE Final   Influenza B by PCR NEGATIVE NEGATIVE Final    Comment: (NOTE) The Xpert Xpress SARS-CoV-2/FLU/RSV plus assay is intended as an aid in the diagnosis of influenza from Nasopharyngeal swab specimens and should not be used as a sole basis for treatment. Nasal washings and aspirates are unacceptable for Xpert Xpress SARS-CoV-2/FLU/RSV testing.  Fact Sheet for Patients: BloggerCourse.com  Fact Sheet for Healthcare Providers: SeriousBroker.it  This test is not yet approved or cleared by the [place] FDA and has been authorized for detection and/or diagnosis of SARS-CoV-2 by FDA under an Emergency Use Authorization (EUA). This EUA will remain in effect (meaning this test can be used) for the duration of the COVID-19 declaration under Section 564(b)(1) of the Act, 21 U.S.C. section 360bbb-3(b)(1), unless the authorization is terminated or revoked.  Performed at [facility], [address] [place], [place] [address]   Urine Culture     Status: None   Collection Time: 03/08/21 12:55 AM   Specimen: Urine, Clean Catch  Result Value Ref Range Status   Specimen Description URINE, CLEAN CATCH  Final   Special Requests NONE  Final   Culture   Final    NO GROWTH Performed at [facility], [address], [place], [place] [address]    Report Status 03/09/2021 FINAL  Final     Time coordinating discharge:  I have spent 35 minutes face to face with the patient and on the ward discussing the patients care, assessment, plan and disposition with other care givers. >50% of the time was devoted counseling the patient about  the risks and benefits of treatment/Discharge disposition and coordinating care.   SIGNED:   Dimple Nanas, MD  [facility] 03/10/2021, 6:29 PM   If 7PM-7AM, please contact night-coverage

## 2021-03-10 NOTE — Plan of Care (Signed)
  Problem: Pain Managment: °Goal: General experience of comfort will improve °Outcome: Completed/Met °  °Problem: Elimination: °Goal: Will not experience complications related to urinary retention °Outcome: Completed/Met °  °Problem: Elimination: °Goal: Will not experience complications related to bowel motility °Outcome: Completed/Met °  °

## 2021-03-15 ENCOUNTER — Emergency Department (HOSPITAL_COMMUNITY): Payer: Medicare Other

## 2021-03-15 ENCOUNTER — Emergency Department (HOSPITAL_COMMUNITY)
Admission: EM | Admit: 2021-03-15 | Discharge: 2021-03-15 | Disposition: A | Discharge status: Home / Self Care | Payer: Medicare Other | Attending: Emergency Medicine | Admitting: Emergency Medicine

## 2021-03-15 ENCOUNTER — Ambulatory Visit: Payer: Medicare Other | Admitting: Student

## 2021-03-15 ENCOUNTER — Encounter (HOSPITAL_COMMUNITY): Payer: Self-pay

## 2021-03-15 ENCOUNTER — Other Ambulatory Visit: Payer: Self-pay

## 2021-03-15 DIAGNOSIS — N183 Chronic kidney disease, stage 3 unspecified: Secondary | ICD-10-CM | POA: Insufficient documentation

## 2021-03-15 DIAGNOSIS — N200 Calculus of kidney: Secondary | ICD-10-CM | POA: Diagnosis not present

## 2021-03-15 DIAGNOSIS — R109 Unspecified abdominal pain: Secondary | ICD-10-CM

## 2021-03-15 DIAGNOSIS — I509 Heart failure, unspecified: Secondary | ICD-10-CM | POA: Diagnosis not present

## 2021-03-15 DIAGNOSIS — Z79899 Other long term (current) drug therapy: Secondary | ICD-10-CM | POA: Insufficient documentation

## 2021-03-15 DIAGNOSIS — I130 Hypertensive heart and chronic kidney disease with heart failure and stage 1 through stage 4 chronic kidney disease, or unspecified chronic kidney disease: Secondary | ICD-10-CM | POA: Diagnosis not present

## 2021-03-15 DIAGNOSIS — E1122 Type 2 diabetes mellitus with diabetic chronic kidney disease: Secondary | ICD-10-CM | POA: Insufficient documentation

## 2021-03-15 DIAGNOSIS — K8020 Calculus of gallbladder without cholecystitis without obstruction: Secondary | ICD-10-CM

## 2021-03-15 DIAGNOSIS — F0390 Unspecified dementia without behavioral disturbance: Secondary | ICD-10-CM | POA: Diagnosis not present

## 2021-03-15 DIAGNOSIS — R079 Chest pain, unspecified: Secondary | ICD-10-CM | POA: Diagnosis present

## 2021-03-15 DIAGNOSIS — I160 Hypertensive urgency: Secondary | ICD-10-CM

## 2021-03-15 LAB — COMPREHENSIVE METABOLIC PANEL
ALT: 68 U/L — ABNORMAL HIGH (ref 0–44)
AST: 183 U/L — ABNORMAL HIGH (ref 15–41)
Albumin: 2.5 g/dL — ABNORMAL LOW (ref 3.5–5.0)
Alkaline Phosphatase: 301 U/L — ABNORMAL HIGH (ref 38–126)
Anion gap: 8 (ref 5–15)
BUN: 23 mg/dL (ref 8–23)
CO2: 22 mmol/L (ref 22–32)
Calcium: 8.6 mg/dL — ABNORMAL LOW (ref 8.9–10.3)
Chloride: 105 mmol/L (ref 98–111)
Creatinine, Ser: 1.34 mg/dL — ABNORMAL HIGH (ref 0.61–1.24)
GFR, Estimated: 51 mL/min — ABNORMAL LOW (ref >60)
Glucose, Bld: 142 mg/dL — ABNORMAL HIGH (ref 70–99)
Potassium: 4.4 mmol/L (ref 3.5–5.1)
Sodium: 135 mmol/L (ref 135–145)
Total Bilirubin: 2.1 mg/dL — ABNORMAL HIGH (ref 0.3–1.2)
Total Protein: 6.2 g/dL — ABNORMAL LOW (ref 6.5–8.1)

## 2021-03-15 LAB — CBC WITH DIFFERENTIAL/PLATELET
Abs Immature Granulocytes: 0.07 10*3/uL (ref 0.00–0.07)
Basophils Absolute: 0.1 10*3/uL (ref 0.0–0.1)
Basophils Relative: 1 %
Eosinophils Absolute: 0.1 10*3/uL (ref 0.0–0.5)
Eosinophils Relative: 1 %
HCT: 39.1 % (ref 39.0–52.0)
Hemoglobin: 12.7 g/dL — ABNORMAL LOW (ref 13.0–17.0)
Immature Granulocytes: 1 %
Lymphocytes Relative: 12 %
Lymphs Abs: 1 10*3/uL (ref 0.7–4.0)
MCH: 31.9 pg (ref 26.0–34.0)
MCHC: 32.5 g/dL (ref 30.0–36.0)
MCV: 98.2 fL (ref 80.0–100.0)
Monocytes Absolute: 0.7 10*3/uL (ref 0.1–1.0)
Monocytes Relative: 8 %
Neutro Abs: 6.6 10*3/uL (ref 1.7–7.7)
Neutrophils Relative %: 77 %
Platelets: 205 10*3/uL (ref 150–400)
RBC: 3.98 MIL/uL — ABNORMAL LOW (ref 4.22–5.81)
RDW: 13.6 % (ref 11.5–15.5)
WBC: 8.5 10*3/uL (ref 4.0–10.5)
nRBC: 0 % (ref 0.0–0.2)

## 2021-03-15 LAB — URINALYSIS, ROUTINE W REFLEX MICROSCOPIC
Bilirubin Urine: NEGATIVE
Glucose, UA: NEGATIVE mg/dL
Hgb urine dipstick: NEGATIVE
Ketones, ur: NEGATIVE mg/dL
Leukocytes,Ua: NEGATIVE
Nitrite: NEGATIVE
Protein, ur: NEGATIVE mg/dL
Specific Gravity, Urine: 1.021 (ref 1.005–1.030)
pH: 5 (ref 5.0–8.0)

## 2021-03-15 LAB — BRAIN NATRIURETIC PEPTIDE: B Natriuretic Peptide: 607.6 pg/mL — ABNORMAL HIGH (ref 0.0–100.0)

## 2021-03-15 LAB — TROPONIN I (HIGH SENSITIVITY)
Troponin I (High Sensitivity): 20 ng/L — ABNORMAL HIGH (ref <18)
Troponin I (High Sensitivity): 21 ng/L — ABNORMAL HIGH (ref <18)

## 2021-03-15 LAB — LIPASE, BLOOD: Lipase: 59 U/L — ABNORMAL HIGH (ref 11–51)

## 2021-03-15 MED ORDER — SODIUM CHLORIDE 0.9 % IV BOLUS
250.0000 mL | Freq: Once | INTRAVENOUS | Status: AC
  Administered 2021-03-15: 250 mL via INTRAVENOUS

## 2021-03-15 MED ORDER — IOHEXOL 300 MG/ML  SOLN
100.0000 mL | Freq: Once | INTRAMUSCULAR | Status: AC | PRN
  Administered 2021-03-15: 100 mL via INTRAVENOUS

## 2021-03-15 MED ORDER — HYDRALAZINE HCL 20 MG/ML IJ SOLN
10.0000 mg | Freq: Once | INTRAMUSCULAR | Status: AC
  Administered 2021-03-15: 10 mg via INTRAVENOUS
  Filled 2021-03-15: qty 1

## 2021-03-15 MED ORDER — SUCRALFATE 1 G PO TABS: 1.0000 g | ORAL_TABLET | Freq: Three times a day (TID) | ORAL | 0 refills | Status: DC

## 2021-03-15 NOTE — ED Provider Notes (Addendum)
[facility] EMERGENCY DEPARTMENT Provider Note   CSN: [id number] Arrival date & time: 03/15/21  1532     History Chief Complaint  Patient presents with   Chest Pain   Weakness    Tyris Eliot is a 87 y.o. male with a past medical history of A. fib, CAD, dementia, diabetes, hypertension, CHF presenting to the ED for chest pain. History is provided by daughter at the bedside. States patient was discharged from hospital stay on 03/10/2021.  Since then he has been feeling at his baseline been compliant with medications.  Today saw his PCP and was discharged home.  Around 2:00 PM was at home with his caretaker when he complained of pain in his lower chest/upper abdomen which lasted until EMS arrived.  Patient does not recall this event.  States that he is "not currently having any chest pain.  Did not complain of any shortness of breath, vomiting, nausea at that time.  Daughter states that he is at his mental baseline.   HPI     Past Medical History:  Diagnosis Date   Atrial fibrillation (HCC)    Coronary artery disease    Dementia (HCC)    Diabetes mellitus without complication (HCC)    Hypertension     Patient Active Problem List   Diagnosis Date Noted   Pressure injury of skin 03/08/2021   Acute CHF (congestive heart failure) (HCC) 03/07/2021   AF (paroxysmal atrial fibrillation) (HCC) 03/07/2021   CKD (chronic kidney disease) stage 3, GFR 30-59 ml/min (HCC) 03/07/2021   Hypertensive urgency 03/07/2021   Confusion 03/07/2021   Abnormal CT of the head 03/07/2021   Diabetes mellitus type 2 in nonobese (HCC) 03/07/2021   Dementia (HCC) 03/07/2021   Heart murmur 03/07/2021   A-fib (HCC) 05/11/2020   Angioectopia 05/11/2020    Past Surgical History:  Procedure Laterality Date   CORONARY ANGIOPLASTY         History reviewed. No pertinent family history.  Social History   Tobacco Use   Smoking status: Never   Smokeless tobacco: Never  Vaping Use    Vaping Use: Never used  Substance Use Topics   Alcohol use: Never   Drug use: Never    Home Medications Prior to Admission medications   Medication Sig Start Date End Date Taking? Authorizing Provider  acetaminophen (TYLENOL) 500 MG tablet Take 500 mg by mouth in the morning and at bedtime.   Yes [provider]  donepezil (ARICEPT) 10 MG tablet Take 10 mg by mouth daily in the afternoon. 04/13/20  Yes [provider]  hydrALAZINE (APRESOLINE) 25 MG tablet Take 1 tablet (25 mg total) by mouth 3 (three) times daily. Patient taking differently: Take 25 mg by mouth See admin instructions. Take one tablet by mouth twice daily may take a third tablet if blood pressure is not elevated 02/13/21 06/13/21 Yes Cantwell, Celeste C, PA-C  lisinopril (ZESTRIL) 40 MG tablet Take 40 mg by mouth daily in the afternoon. 07/14/20  Yes [provider]    Allergies    Patient has no known allergies.  Review of Systems   Review of Systems  Unable to perform ROS: Dementia   Physical Exam Updated Vital Signs BP (!) 195/90   Pulse (!) 45   Temp 98.3 F (36.8 C) (Oral)   Resp 15   SpO2 99%   Physical Exam Vitals and nursing note reviewed.  Constitutional:      General: He is not in acute distress.  Appearance: He is well-developed.  HENT:     Head: Normocephalic and atraumatic.     Nose: Nose normal.  Eyes:     General: No scleral icterus.       Left eye: No discharge.     Conjunctiva/sclera: Conjunctivae normal.  Cardiovascular:     Rate and Rhythm: Normal rate and regular rhythm.     Heart sounds: Normal heart sounds. No murmur heard.   No friction rub. No gallop.  Pulmonary:     Effort: Pulmonary effort is normal. No respiratory distress.     Breath sounds: Normal breath sounds.  Abdominal:     General: Bowel sounds are normal. There is no distension.     Palpations: Abdomen is soft.     Tenderness: There is no abdominal tenderness. There is no guarding.      Comments: Abdomen is soft and nontender.  Chest is nontender.  Musculoskeletal:        General: Normal range of motion.     Cervical back: Normal range of motion and neck supple.  Skin:    General: Skin is warm and dry.     Findings: No rash.  Neurological:     Mental Status: He is alert.     Motor: No abnormal muscle tone.     Coordination: Coordination normal.    ED Results / Procedures / Treatments   Labs (all labs ordered are listed, but only abnormal results are displayed) Labs Reviewed  CBC WITH DIFFERENTIAL/PLATELET - Abnormal; Notable for the following components:      Result Value   RBC 3.98 (*)    Hemoglobin 12.7 (*)    All other components within normal limits  COMPREHENSIVE METABOLIC PANEL - Abnormal; Notable for the following components:   Glucose, Bld 142 (*)    Creatinine, Ser 1.34 (*)    Calcium 8.6 (*)    Total Protein 6.2 (*)    Albumin 2.5 (*)    AST 183 (*)    ALT 68 (*)    Alkaline Phosphatase 301 (*)    Total Bilirubin 2.1 (*)    GFR, Estimated 51 (*)    All other components within normal limits  BRAIN NATRIURETIC PEPTIDE - Abnormal; Notable for the following components:   B Natriuretic Peptide 607.6 (*)    All other components within normal limits  LIPASE, BLOOD - Abnormal; Notable for the following components:   Lipase 59 (*)    All other components within normal limits  TROPONIN I (HIGH SENSITIVITY) - Abnormal; Notable for the following components:   Troponin I (High Sensitivity) 20 (*)    All other components within normal limits  URINALYSIS, ROUTINE W REFLEX MICROSCOPIC  TROPONIN I (HIGH SENSITIVITY)    EKG None  Radiology DG Chest Port 1 View  Result Date: 03/15/2021 CLINICAL DATA:  History of CHF.  Weakness.  Chest pain. EXAM: PORTABLE CHEST 1 VIEW COMPARISON:  03/07/2021 FINDINGS: Cardiac enlargement. Negative for heart failure or edema. Improved aeration in the bases. Mild residual left lower lobe airspace disease and left effusion  which is improved. Improved aeration right lung base. Atherosclerotic aortic arch. IMPRESSION: Improved aeration in the lung bases with persistent mild left lower lobe airspace disease and small left effusion. Electronically Signed   By: Marlan Palau M.D.   On: 03/15/2021 16:43    Procedures .Critical Care  Date/Time: 03/15/2021 6:28 PM Performed by: Dietrich Pates, PA-C Authorized by: Dietrich Pates, PA-C   Critical care provider statement:  Critical care time (minutes):  35   Critical care time was exclusive of:  Separately billable procedures and treating other patients and teaching time   Critical care was necessary to treat or prevent imminent or life-threatening deterioration of the following conditions:  Cardiac failure, circulatory failure and CNS failure or compromise   Critical care was time spent personally by me on the following activities:  Development of treatment plan with patient or surrogate, evaluation of patient's response to treatment, examination of patient, obtaining history from patient or surrogate, ordering and performing treatments and interventions, ordering and review of laboratory studies, ordering and review of radiographic studies, re-evaluation of patient's condition and review of old charts   I assumed direction of critical care for this patient from another provider in my specialty: no     Medications Ordered in ED Medications  hydrALAZINE (APRESOLINE) injection 10 mg (has no administration in time range)  sodium chloride 0.9 % bolus 250 mL (0 mLs Intravenous Stopped 03/15/21 1635)    ED Course  I have reviewed the triage vital signs and the nursing notes.  Pertinent labs & imaging results that were available during my care of the patient were reviewed by me and considered in my medical decision making (see chart for details).  Clinical Course as of 03/15/21 1856  Wed Mar 15, 2021  1655 Hemoglobin(!): 12.7 [HK]  1702 Troponin I (High Sensitivity)(!): 20  [HK]  1702 Creatinine(!): 1.34 [HK]  1702 Total Bilirubin(!): 2.1 [HK]  1712 Lipase(!): 59 [HK]  1712 B Natriuretic Peptide(!): 607.6 [HK]  1818 BP(!): 203/73 Will give hydralazine. [HK]    Clinical Course User Index [HK] Dietrich Pates, PA-C   MDM Rules/Calculators/A&P                           87 year old male with past medical history of A. fib, CAD, dementia, diabetes, hypertension, CHF presenting to the ED for chest pain.  History provided by daughter at the bedside.  Discharged from hospital stay on 03/10/2021.  Has been at his baseline up until today.  He had a PCP appointment which daughter states "went well."  When he got home around 2 PM was with his caretaker and he complained of pain in his lower chest/upper abdomen.  This lasted until EMS arrived.  During my evaluation patient does not recall having this pain.  He is not currently having any chest pain.  Denies any abdominal pain, shortness of breath, vomiting or nausea.  On exam abdomen is soft and nontender.  He is bradycardic but this is his baseline.  He has a history of A. fib.  His lungs are clear to auscultation bilaterally.  No lower extremity edema bilaterally.  Oxygen saturations are 100% on room air.  EKG with possible T wave inversions that are new from last week.  He is hypertensive here.  Will obtain lab work, chest x-ray and reassess. Care handed off to oncoming provider pending remainder of work up and disposition.   Portions of this note were generated with Scientist, clinical (histocompatibility and immunogenetics). Dictation errors may occur despite best attempts at proofreading.  Final Clinical Impression(s) / ED Diagnoses Final diagnoses:  Hypertensive urgency    Rx / DC Orders ED Discharge Orders     None           Dietrich Pates, PA-C 03/15/21 1857    Sloan Leiter, DO 03/16/21 0018

## 2021-03-15 NOTE — ED Triage Notes (Signed)
Pt BIB [facility] d/t CP & Weakness. Pt was evaluated at his PCP for a recent CHF exacerbation here in [facility]. Post appointment pt began feeling CP in his lower chest/upper abd & general overall weakness. EMS reports he was bradycardic (Hx of the same). A/Ox2- confused to time/situation. Was given 324 ASA en route. DNR at besdide 50 bpm

## 2021-03-15 NOTE — ED Notes (Signed)
X-ray at bedside

## 2021-03-17 NOTE — Progress Notes (Signed)
External labs 03/15/2021: Glucose 134, BUN 23, creatinine 1.22, GFR 57, sodium 137, potassium 4.9 Hgb 14.0, HCT 40.7, MCV 94

## 2021-03-17 NOTE — Progress Notes (Signed)
Looks like he was in hospital for heart failure. Can you plesae make sure he has follow up  in the next 2 weeks

## 2021-03-20 NOTE — Progress Notes (Signed)
Okay, thank you for keeping me in the loop.

## 2021-03-25 NOTE — Progress Notes (Signed)
Primary Physician/Referring:  Kaleen MaskElkins, Wilson Oliver, MD  Patient ID: Peter Martin, male    DOB: 12/26/1929, 87 y.o.   MRN: [id number]  Chief Complaint  Patient presents with   Atrial Fibrillation   Follow-up   HPI:    Peter Martin  is a 87 y.o. caucasian male with  History of coronary artery disease status post arthrectomy and angioplasty, hypertension, non-insulin-dependent diabetes mellitus type 2, advanced age, mild aortic stenosis and moderate MR/TR. He was originally referred to our office for management of atrial fibrillation.   In the past patient has been recommended to undergo nuclear stress test and start anticoagulation, however he has opted not to.   Patient was recently hospitalized 03/07/2021 - 03/10/2021 with hypertensive urgency driving acute heart failure exacerbation.  Patient was diuresed, added lisinopril, and uptitrated hydralazine.  Patient did undergo MRI of head which revealed chronic ischemic changes, therefore it is recommended patient avoid strict blood pressure control.  Patient now presents for follow-up.  Patient is accompanied by his daughter as well as his caretaker.  Since discharge from the hospital patient has had increased fatigue and decreased appetite.  He has not been walking as much as he previously was and in fact has been sleeping more.  Otherwise denies chest pain, palpitations, syncope, near syncope, dizziness, orthopnea.  Patient's caretaker brings with her a written log of his blood pressure readings, which remain elevated.  He is presently taking lisinopril 40 mg daily and hydralazine 25 mg 3 times daily, however his caretaker is holding hydralazine if systolic blood pressure <140 mmHg.  Notably patient does have 24/7 caregiver in view of progressive dementia.  Past Medical History:  Diagnosis Date   Atrial fibrillation (HCC)    Coronary artery disease    Dementia (HCC)    Diabetes mellitus without complication (HCC)    Hypertension    Past  Surgical History:  Procedure Laterality Date   CORONARY ANGIOPLASTY     Family History  Problem Relation Age of Onset   Heart disease Mother    Stroke Father [id number]    Social History   Tobacco Use   Smoking status: Never   Smokeless tobacco: Never  Substance Use Topics   Alcohol use: Never   Marital Status: Widowed   ROS  Review of Systems  Constitutional: Positive for decreased appetite and malaise/fatigue. Negative for weight gain.  Cardiovascular:  Negative for chest pain, claudication, leg swelling, near-syncope, orthopnea, palpitations, paroxysmal nocturnal dyspnea and syncope.  Respiratory:  Negative for shortness of breath.   Neurological:  Negative for dizziness.   Objective  Blood pressure (!) 153/73, pulse (!) 46, temperature (!) 96.9 F (36.1 C), temperature source Temporal, resp. rate 17, height 5\' 10"  (1.778 m), weight 144 lb 12.8 oz (65.7 kg), SpO2 97 %.  Vitals with BMI 03/27/2021 03/27/2021 03/15/2021  Height - 5\' 10"  -  Weight - 144 lbs 13 oz -  BMI - 20.78 -  Systolic 153 165 [id number]  Diastolic 73 89 143  Pulse 46 58 63      Physical Exam Vitals reviewed.  HENT:     Head: Normocephalic and atraumatic.  Cardiovascular:     Rate and Rhythm: Bradycardia present. Rhythm irregularly irregular.     Pulses: Intact distal pulses.     Heart sounds: S1 normal and S2 normal. Murmur heard.  Systolic murmur is present with a grade of 2/6 at the upper right sternal border.  Systolic murmur of grade 2/6 is also present at the  apex.    No gallop.  Pulmonary:     Effort: Pulmonary effort is normal. No respiratory distress.     Breath sounds: No wheezing, rhonchi or rales.  Musculoskeletal:     Right lower leg: No edema.     Left lower leg: No edema.  Skin:    General: Skin is warm and dry.  Neurological:     General: No focal deficit present.     Mental Status: He is alert. Mental status is at baseline.    Laboratory examination:   Recent Labs    03/09/21 0343  03/10/21 0332 03/15/21 1622  NA 133* 138 135  K 3.6 3.4* 4.4  CL 98 106 105  CO2 25 25 22   GLUCOSE 149* 148* 142*  BUN 29* 31* 23  CREATININE 1.44* 1.42* 1.34*  CALCIUM 8.8* 8.7* 8.6*  GFRNONAA 46* 47* 51*   estimated creatinine clearance is 34.7 mL/min (A) (by C-G formula based on SCr of 1.34 mg/dL (H)).  CMP Latest Ref Rng & Units 03/15/2021 03/10/2021 03/09/2021  Glucose 70 - 99 mg/dL 05/09/2021) 063(K) 160(F)  BUN 8 - 23 mg/dL 23 093(A) 35(T)  Creatinine 0.61 - 1.24 mg/dL 73(U) 2.02(R) 4.27(C)  Sodium 135 - 145 mmol/L 135 138 133(L)  Potassium 3.5 - 5.1 mmol/L 4.4 3.4(L) 3.6  Chloride 98 - 111 mmol/L 105 106 98  CO2 22 - 32 mmol/L 22 25 25   Calcium 8.9 - 10.3 mg/dL 6.23(J) ) 6.2(G)  Total Protein 6.5 - 8.1 g/dL 6.2(L) - -  Total Bilirubin 0.3 - 1.2 mg/dL 2.1(H) - -  Alkaline Phos 38 - 126 U/L 301(H) - -  AST 15 - 41 U/L 183(H) - -  ALT 0 - 44 U/L 68(H) - -   CBC Latest Ref Rng & Units 03/15/2021 03/10/2021 03/09/2021  WBC 4.0 - 10.5 K/uL 8.5 8.6 9.5  Hemoglobin 13.0 - 17.0 g/dL 12.7(L) 13.8 13.3  Hematocrit 39.0 - 52.0 % 39.1 40.2 39.6  Platelets 150 - 400 K/uL 205 152 122(L)    Lipid Panel No results for input(s): CHOL, TRIG, LDLCALC, VLDL, HDL, CHOLHDL, LDLDIRECT in the last 8760 hours.  HEMOGLOBIN A1C Lab Results  Component Value Date   HGBA1C 6.4 (H) 03/08/2021   MPG 136.98 03/08/2021   TSH Recent Labs    03/08/21 0851  TSH 2.201    External labs:   09/20/2020: Glucose 139, BUN 26, creatinine 1.04, GFR 64, sodium 141, potassium 4.0, Hemoglobin 14.1, hematocrit 41.1, MCV 94, platelet 221  Allergies  No Known Allergies   Medications Prior to Visit:   Outpatient Medications Prior to Visit  Medication Sig Dispense Refill   acetaminophen (TYLENOL) 500 MG tablet Take 500 mg by mouth in the morning and at bedtime.     donepezil (ARICEPT) 10 MG tablet Take 10 mg by mouth daily in the afternoon.     lisinopril (ZESTRIL) 40 MG tablet Take 40 mg by mouth daily in  the afternoon.     ursodiol (ACTIGALL) 250 MG tablet Take 250 mg by mouth 2 (two) times daily.     hydrALAZINE (APRESOLINE) 25 MG tablet Take 1 tablet (25 mg total) by mouth 3 (three) times daily. (Patient taking differently: Take 25 mg by mouth See admin instructions. Take one tablet by mouth twice daily may take a third tablet if blood pressure is not elevated) 90 tablet 3   sucralfate (CARAFATE) 1 g tablet Take 1 tablet (1 g total) by mouth 4 (four) times daily -  with meals and at bedtime. 20 tablet 0   No facility-administered medications prior to visit.   Final Medications at End of Visit    Current Meds  Medication Sig   acetaminophen (TYLENOL) 500 MG tablet Take 500 mg by mouth in the morning and at bedtime.   donepezil (ARICEPT) 10 MG tablet Take 10 mg by mouth daily in the afternoon.   lisinopril (ZESTRIL) 40 MG tablet Take 40 mg by mouth daily in the afternoon.   ursodiol (ACTIGALL) 250 MG tablet Take 250 mg by mouth 2 (two) times daily.   [DISCONTINUED] hydrALAZINE (APRESOLINE) 25 MG tablet Take 1 tablet (25 mg total) by mouth 3 (three) times daily. (Patient taking differently: Take 25 mg by mouth See admin instructions. Take one tablet by mouth twice daily may take a third tablet if blood pressure is not elevated)   Radiology:   No results found.  Cardiac Studies:  Echocardiogram 03/08/2021:  1. Left ventricular ejection fraction, by estimation, is 55 to 60%. The  left ventricle has normal function. The left ventricle has no regional  wall motion abnormalities. There is mild left ventricular hypertrophy.  Left ventricular diastolic parameters  are consistent with Grade II diastolic dysfunction (pseudonormalization).   2. Right ventricular systolic function is normal. The right ventricular  size is moderately enlarged. There is moderately elevated pulmonary artery  systolic pressure. The estimated right ventricular systolic pressure is  45.2 mmHg.   3. Left atrial size was  moderately dilated.   4. Right atrial size was moderately dilated.   5. The mitral valve is normal in structure. Moderate mitral valve  regurgitation. No evidence of mitral stenosis. Moderate mitral annular  calcification.   6. Functional bicuspid. The aortic valve is calcified. There is severe  calcifcation of the aortic valve. There is severe thickening of the aortic  valve. Aortic valve regurgitation is mild. Moderate aortic valve stenosis.  Aortic valve area, by VTI  measures 1.13 cm. Aortic valve mean gradient measures 22.0 mmHg. Aortic  valve Vmax measures 2.94 m/s.   7. Pulmonic valve regurgitation is moderate.   8. The inferior vena cava is dilated in size with >50% respiratory  variability, suggesting right atrial pressure of 8 mmHg.   Echocardiogram 11/27/2019:  LVEF 67%, unable to evaluate diastolic function secondary to atrial fibrillation, normal wall motion, mild LVH, mild aortic stenosis, mild AR, moderate MR moderate TR, mild PR, RVSP 31 mmHg.   Heart Catheterization 05/14/2000: INDICATIONS:  Mr. Leser is a 87 year old gentleman with a history of chest pain.  He underwent heart catheterization back in July and was found to have a tight stenosis and a moderate sized diagonal branch.  He was treated medically but continued to have chest pain even despite cardiac rehabilitation.  He was referred for repeat heart catheterization and possible rotational atherectomy. Conclusions: 1. Successful rotational atherectomy and percutaneous transluminal coronary  angioplasty of the first diagonal vessel. 2. Mild to moderate irregularities involving the other vessels. 3. Normal left ventricular systolic function.  EKG:  03/28/2021: Atrial fibrillation at a rate of 45 bpm.   12/17/2019: Atrial fibrillation, 58 bpm, LVH per voltage criteria, ST-T changes most likely secondary to underlying LVH.  Assessment     ICD-10-CM   1. Essential hypertension  I10 EKG 12-Lead    2. Persistent  atrial fibrillation (HCC)  I48.19 EKG 12-Lead    3. Bradycardia  R00.1        Medications Discontinued During This Encounter  Medication Reason  sucralfate (CARAFATE) 1 g tablet Error   hydrALAZINE (APRESOLINE) 25 MG tablet Reorder    Meds ordered this encounter  Medications   hydrALAZINE (APRESOLINE) 25 MG tablet    Sig: Take 2 tablets (50 mg total) by mouth 3 (three) times daily.    Dispense:  90 tablet    Refill:  3    This patients CHA2DS2-VASc Score 5 (HTN, Age, DM, CAD) and yearly risk of stroke 7.2%.   Recommendations:   Peter Martin is a 87 y.o. caucasian male with  History of coronary artery disease status post arthrectomy and angioplasty, hypertension, non-insulin-dependent diabetes mellitus type 2, advanced age, mild aortic stenosis and moderate MR/TR. He was originally referred to our office for management of atrial fibrillation.   Patient was recently hospitalized 03/07/2021 - 03/10/2021 with hypertensive urgency driving acute heart failure exacerbation.  Patient was diuresed, added lisinopril, and uptitrated hydralazine.  Patient did undergo MRI which revealed chronic ischemic changes, therefore it is recommended patient avoid strict blood pressure control.  Patient now presents for follow-up.  Patient has had continued fatigue since discharge from the hospital.  He is following strict low-fat diet and has not been exercising much.  Encourage patient to return to walking regularly throughout the day using walker.  Recommend patient increase his physical activity under supervision of his caretaker.  EKG today revealed marked bradycardia unchanged compared to previous, on previous visit patient has demonstrated chronotropic competence walking in the office hallway.  No indication for pacemaker at this time.  Discussion with patient and his daughter, they state would likely not be interested in pacemaker implantation, and will hold off on electrophysiology referral at this time as  patient is without significant symptoms.  Patient and his daughter continue to refer to hold off on further ischemic evaluation as well as anticoagulation given underlying dementia.  Patient's blood pressure remains elevated.  Recommend blood pressure control given MRI of the head consistent with chronic ischemic changes.  However would prefer blood pressure closer to 140/80 mmHg.  We will increase hydralazine to 50 mg 3 times daily, hold for systolic blood pressure <140 mmHg.  We will continue lisinopril 40 mg daily.  Encourage patient's caretaker to continue to keep detailed blood pressure and heart rate log and bring it to the next office visit.  Follow-up in 6 weeks, sooner if needed, for hypertension and fatigue.     Rayford Halsted, PA-C 03/28/2021, 9:40 AM Office: [phone]

## 2021-03-27 ENCOUNTER — Encounter: Payer: Self-pay | Admitting: Student

## 2021-03-27 ENCOUNTER — Ambulatory Visit: Payer: Medicare Other | Admitting: Student

## 2021-03-27 ENCOUNTER — Other Ambulatory Visit: Payer: Self-pay

## 2021-03-27 VITALS — BP 153/73 | HR 46 | Temp 96.9°F | Resp 17 | Ht 70.0 in | Wt 144.8 lb

## 2021-03-27 DIAGNOSIS — R001 Bradycardia, unspecified: Secondary | ICD-10-CM

## 2021-03-27 DIAGNOSIS — I4819 Other persistent atrial fibrillation: Secondary | ICD-10-CM

## 2021-03-27 DIAGNOSIS — I10 Essential (primary) hypertension: Secondary | ICD-10-CM

## 2021-03-27 MED ORDER — HYDRALAZINE HCL 25 MG PO TABS: 50.0000 mg | ORAL_TABLET | Freq: Three times a day (TID) | ORAL | 3 refills | Status: DC

## 2021-03-28 NOTE — Telephone Encounter (Signed)
error 

## 2021-04-12 ENCOUNTER — Telehealth: Payer: Self-pay

## 2021-04-12 ENCOUNTER — Other Ambulatory Visit: Payer: Self-pay

## 2021-04-12 MED ORDER — HYDRALAZINE HCL 50 MG PO TABS: 50.0000 mg | ORAL_TABLET | Freq: Three times a day (TID) | ORAL | 3 refills | Status: DC

## 2021-04-12 MED ORDER — HYDRALAZINE HCL 50 MG PO TABS: 50.0000 mg | ORAL_TABLET | Freq: Three times a day (TID) | ORAL | 3 refills | Status: AC

## 2021-04-12 NOTE — Telephone Encounter (Signed)
Done

## 2021-04-12 NOTE — Telephone Encounter (Signed)
Yes that is fine. Also in the future feel free to look at my last note and if the medication has been increased you can refill with the new dosage

## 2021-04-12 NOTE — Telephone Encounter (Signed)
Pts daughter called and requesting a refill for hydralazine. She would like for [place] to send in 50 mg tablets. Is this okay? Please advise.

## 2021-04-18 ENCOUNTER — Encounter (HOSPITAL_COMMUNITY): Payer: Self-pay | Admitting: Radiology

## 2021-04-27 ENCOUNTER — Telehealth: Payer: Self-pay

## 2021-04-27 NOTE — Telephone Encounter (Signed)
Patient daughter called and said that pt was having swelling and Kidney Dr. Prentiss Bells him on lasix.

## 2021-04-27 NOTE — Telephone Encounter (Signed)
Noted, will discuss further at upcoming office visit.

## 2021-04-27 NOTE — Telephone Encounter (Signed)
I added this to his med list 20 mg daily

## 2021-05-03 ENCOUNTER — Other Ambulatory Visit: Payer: Self-pay

## 2021-05-03 ENCOUNTER — Ambulatory Visit (INDEPENDENT_AMBULATORY_CARE_PROVIDER_SITE_OTHER): Payer: Medicare Other | Admitting: Podiatry

## 2021-05-03 ENCOUNTER — Encounter: Payer: Self-pay | Admitting: Podiatry

## 2021-05-03 DIAGNOSIS — E1151 Type 2 diabetes mellitus with diabetic peripheral angiopathy without gangrene: Secondary | ICD-10-CM | POA: Diagnosis not present

## 2021-05-03 DIAGNOSIS — M79674 Pain in right toe(s): Secondary | ICD-10-CM

## 2021-05-03 DIAGNOSIS — M79675 Pain in left toe(s): Secondary | ICD-10-CM

## 2021-05-03 DIAGNOSIS — I739 Peripheral vascular disease, unspecified: Secondary | ICD-10-CM | POA: Diagnosis not present

## 2021-05-03 DIAGNOSIS — B351 Tinea unguium: Secondary | ICD-10-CM

## 2021-05-03 NOTE — Progress Notes (Signed)
This patient returns to my office for at risk foot care.  This patient requires this care by a professional since this patient will be at risk due to having diabetes and PAD.  This patient is unable to cut nails himself since the patient cannot reach his nails.These nails are painful walking and wearing shoes.  This patient presents for at risk foot care today.  General Appearance  Alert, conversant and in no acute stress.  Vascular  Dorsalis pedis and posterior tibial  pulses are  weakly palpable  bilaterally.  Capillary return is within normal limits  bilaterally. Temperature is within normal limits  bilaterally.  Neurologic  Senn-Weinstein monofilament wire test within normal limits  bilaterally. Muscle power within normal limits bilaterally.  Nails Thick disfigured discolored nails with subungual debris  from hallux to fifth toes bilaterally. No evidence of bacterial infection or drainage bilaterally.  Orthopedic  No limitations of motion  feet .  No crepitus or effusions noted.  No bony pathology or digital deformities noted. HAV  B/L.  Skin  normotropic skin with no porokeratosis noted bilaterally.  No signs of infections or ulcers noted.     Onychomycosis  Pain in right toes  Pain in left toes  Consent was obtained for treatment procedures.   Mechanical debridement of nails 1-5  bilaterally performed with a nail nipper.  Filed with dremel without incident.    Return office visit     4  months                 Told patient to return for periodic foot care and evaluation due to potential at risk complications.   Helane Gunther DPM

## 2021-05-09 NOTE — Progress Notes (Signed)
Primary Physician/Referring:  Kaleen Mask, MD  Patient ID: Peter Martin, male    DOB: February 19, 1930, 87 y.o.   MRN: [id number]  Chief Complaint  Patient presents with   Hypertension   Follow-up   HPI:    Peter Martin  is a 87 y.o. caucasian male with  History of coronary artery disease status post arthrectomy and angioplasty, hypertension, non-insulin-dependent diabetes mellitus type 2, advanced age, mild aortic stenosis and moderate MR/TR. He was originally referred to our office for management of atrial fibrillation.   In the past patient has been recommended to undergo nuclear stress test and start anticoagulation, however he and his family have opted not to.   Patient was recently hospitalized 03/07/2021 - 03/10/2021 with hypertensive urgency driving acute heart failure exacerbation.  At this time patient did undergo MRI of head which revealed chronic ischemic changes, therefore it is recommended patient avoid strict blood pressure control.    Patient now presents accompanied by his daughter.  He is increased his physical activity and walking more regularly with his caregiver since last office visit.  Last office visit increased hydralazine to 50 mg 3 times daily, which improved blood pressure control.  However 2 weeks ago patient's blood pressure is again increased with home readings averaging 150s systolic and as high as 170 mmHg systolic.  Denies chest pain, palpitations, syncope, near syncope, dizziness, orthopnea.  Past Medical History:  Diagnosis Date   Atrial fibrillation (HCC)    Coronary artery disease    Dementia (HCC)    Diabetes mellitus without complication (HCC)    Hypertension    Past Surgical History:  Procedure Laterality Date   CORONARY ANGIOPLASTY     Family History  Problem Relation Age of Onset   Heart disease Mother    Stroke Father 60    Social History   Tobacco Use   Smoking status: Never   Smokeless tobacco: Never  Substance Use Topics    Alcohol use: Never   Marital Status: Widowed   ROS  Review of Systems  Constitutional: Positive for malaise/fatigue (imrpoved). Negative for weight gain.  Cardiovascular:  Negative for chest pain, claudication, leg swelling, near-syncope, orthopnea, palpitations, paroxysmal nocturnal dyspnea and syncope.  Respiratory:  Negative for shortness of breath.   Neurological:  Negative for dizziness.   Objective  Blood pressure (!) 150/58, pulse (!) 40, temperature 97.6 F (36.4 C), temperature source Temporal, height 5\' 10"  (1.778 m), weight 141 lb 3.2 oz (64 kg), SpO2 98 %.  Vitals with BMI 85/12/2020 03/27/2021 03/27/2021  Height 5\' 10"  - 5\' 10"   Weight 141 lbs 3 oz - 144 lbs 13 oz  BMI 20.26 - 20.78  Systolic 150 153 8/85/2022  Diastolic 58 73 89  Pulse 85 46 58      Physical Exam Vitals reviewed.  Neck:     Vascular: No carotid bruit.  Cardiovascular:     Rate and Rhythm: Bradycardia present. Rhythm irregularly irregular.     Pulses: Intact distal pulses.     Heart sounds: S1 normal and S2 normal. Murmur heard.  Systolic murmur is present with a grade of 2/6 at the upper right sternal border.  Systolic murmur of grade 2/85 is also present at the apex.    No gallop.  Pulmonary:     Effort: Pulmonary effort is normal. No respiratory distress.     Breath sounds: No wheezing, rhonchi or rales.  Musculoskeletal:     Right lower leg: No edema.  Left lower leg: No edema.  Skin:    General: Skin is warm and dry.  Neurological:     Mental Status: He is alert.    Laboratory examination:   Recent Labs    03/09/21 0343 03/10/21 0332 03/15/21 1622  NA 133* 138 135  K 3.6 3.4* 4.4  CL 98 106 105  CO2 25 25 22   GLUCOSE 149* 148* 142*  BUN 29* 31* 23  CREATININE 1.44* 1.42* 1.34*  CALCIUM 8.8* 8.7* 8.6*  GFRNONAA 46* 47* 51*   CrCl cannot be calculated (Patient's most recent lab result is older than the maximum 21 days allowed.).  CMP Latest Ref Rng & Units 03/15/2021 03/10/2021  03/09/2021  Glucose 70 - 99 mg/dL 05/09/2021) 599(J) 570(V)  BUN 8 - 23 mg/dL 23 779(T) 90(Z)  Creatinine 0.61 - 1.24 mg/dL 00(P) 2.33(A) 0.76(A)  Sodium 135 - 145 mmol/L 135 138 133(L)  Potassium 3.5 - 5.1 mmol/L 4.4 3.4(L) 3.6  Chloride 98 - 111 mmol/L 105 106 98  CO2 22 - 32 mmol/L 22 25 25   Calcium 8.9 - 10.3 mg/dL 2.63(F) ) 3.5(K)  Total Protein 6.5 - 8.1 g/dL 6.2(L) - -  Total Bilirubin 0.3 - 1.2 mg/dL 2.1(H) - -  Alkaline Phos 38 - 126 U/L 301(H) - -  AST 15 - 41 U/L 183(H) - -  ALT 0 - 44 U/L 68(H) - -   CBC Latest Ref Rng & Units 03/15/2021 03/10/2021 03/09/2021  WBC 4.0 - 10.5 K/uL 8.5 8.6 9.5  Hemoglobin 13.0 - 17.0 g/dL 12.7(L) 13.8 13.3  Hematocrit 39.0 - 52.0 % 39.1 40.2 39.6  Platelets 150 - 400 K/uL 205 152 122(L)    Lipid Panel No results for input(s): CHOL, TRIG, LDLCALC, VLDL, HDL, CHOLHDL, LDLDIRECT in the last 8760 hours.  HEMOGLOBIN A1C Lab Results  Component Value Date   HGBA1C 6.4 (H) 03/08/2021   MPG 136.98 03/08/2021   TSH Recent Labs    08/85/22 0851  TSH 2.201    External labs:   09/20/2020: Glucose 139, BUN 26, creatinine 1.04, GFR 64, sodium 141, potassium 4.0, Hemoglobin 14.1, hematocrit 41.1, MCV 94, platelet 221  Allergies  No Known Allergies   Medications Prior to Visit:   Outpatient Medications Prior to Visit  Medication Sig Dispense Refill   acetaminophen (TYLENOL) 500 MG tablet Take 500 mg by mouth in the morning and at bedtime.     donepezil (ARICEPT) 10 MG tablet Take 10 mg by mouth daily in the afternoon.     furosemide (LASIX) 20 MG tablet Take 20 mg by mouth as needed.     hydrALAZINE (APRESOLINE) 50 MG tablet Take 1 tablet (50 mg total) by mouth 3 (three) times daily. 270 tablet 3   lisinopril (ZESTRIL) 40 MG tablet Take 40 mg by mouth daily in the afternoon.     ursodiol (ACTIGALL) 250 MG tablet Take 250 mg by mouth 2 (two) times daily.     hydrALAZINE (APRESOLINE) 25 MG tablet Take 50 mg by mouth 3 (three) times daily.      No facility-administered medications prior to visit.   Final Medications at End of Visit    Current Meds  Medication Sig   acetaminophen (TYLENOL) 500 MG tablet Take 500 mg by mouth in the morning and at bedtime.   donepezil (ARICEPT) 10 MG tablet Take 10 mg by mouth daily in the afternoon.   furosemide (LASIX) 20 MG tablet Take 20 mg by mouth as needed.   hydrALAZINE (APRESOLINE)  50 MG tablet Take 1 tablet (50 mg total) by mouth 3 (three) times daily.   isosorbide mononitrate (IMDUR) 30 MG 24 hr tablet Take 1 tablet (30 mg total) by mouth at bedtime.   lisinopril (ZESTRIL) 40 MG tablet Take 40 mg by mouth daily in the afternoon.   ursodiol (ACTIGALL) 250 MG tablet Take 250 mg by mouth 2 (two) times daily.   [DISCONTINUED] hydrALAZINE (APRESOLINE) 25 MG tablet Take 50 mg by mouth 3 (three) times daily.   Radiology:   No results found.  Cardiac Studies:  Echocardiogram 03/08/2021:  1. Left ventricular ejection fraction, by estimation, is 55 to 60%. The  left ventricle has normal function. The left ventricle has no regional  wall motion abnormalities. There is mild left ventricular hypertrophy.  Left ventricular diastolic parameters  are consistent with Grade II diastolic dysfunction (pseudonormalization).   2. Right ventricular systolic function is normal. The right ventricular  size is moderately enlarged. There is moderately elevated pulmonary artery  systolic pressure. The estimated right ventricular systolic pressure is  45.2 mmHg.   3. Left atrial size was moderately dilated.   4. Right atrial size was moderately dilated.   5. The mitral valve is normal in structure. Moderate mitral valve  regurgitation. No evidence of mitral stenosis. Moderate mitral annular  calcification.   6. Functional bicuspid. The aortic valve is calcified. There is severe  calcifcation of the aortic valve. There is severe thickening of the aortic  valve. Aortic valve regurgitation is mild. Moderate  aortic valve stenosis.  Aortic valve area, by VTI  measures 1.13 cm. Aortic valve mean gradient measures 22.0 mmHg. Aortic  valve Vmax measures 2.94 m/s.   7. Pulmonic valve regurgitation is moderate.   8. The inferior vena cava is dilated in size with >50% respiratory  variability, suggesting right atrial pressure of 8 mmHg.   Echocardiogram 11/27/2019:  LVEF 67%, unable to evaluate diastolic function secondary to atrial fibrillation, normal wall motion, mild LVH, mild aortic stenosis, mild AR, moderate MR moderate TR, mild PR, RVSP 31 mmHg.   Heart Catheterization 05/14/2000: INDICATIONS:  Mr. Clayburn is a 87 year old gentleman with a history of chest pain.  He underwent heart catheterization back in July and was found to have a tight stenosis and a moderate sized diagonal branch.  He was treated medically but continued to have chest pain even despite cardiac rehabilitation.  He was referred for repeat heart catheterization and possible rotational atherectomy. Conclusions: 1. Successful rotational atherectomy and percutaneous transluminal coronary  angioplasty of the first diagonal vessel. 2. Mild to moderate irregularities involving the other vessels. 3. Normal left ventricular systolic function.  EKG:  03/28/2021: Atrial fibrillation at a rate of 45 bpm.   12/17/2019: Atrial fibrillation, 58 bpm, LVH per voltage criteria, ST-T changes most likely secondary to underlying LVH.  Assessment     ICD-10-CM   1. Essential hypertension  I10     2. Bradycardia  R00.1         Medications Discontinued During This Encounter  Medication Reason   hydrALAZINE (APRESOLINE) 25 MG tablet Error    Meds ordered this encounter  Medications   isosorbide mononitrate (IMDUR) 30 MG 24 hr tablet    Sig: Take 1 tablet (30 mg total) by mouth at bedtime.    Dispense:  30 tablet    Refill:  3    This patients CHA2DS2-VASc Score 5 (HTN, Age, DM, CAD) and yearly risk of stroke 7.2%.    Recommendations:  Peter Martin is a 87 y.o. caucasian male with  History of coronary artery disease status post arthrectomy and angioplasty, hypertension, non-insulin-dependent diabetes mellitus type 2, advanced age, mild aortic stenosis and moderate MR/TR. He was originally referred to our office for management of atrial fibrillation.   In the past patient has been recommended to undergo nuclear stress test and start anticoagulation, however he and his family have opted not to.   Patient was recently hospitalized 03/07/2021 - 03/10/2021 with hypertensive urgency driving acute heart failure exacerbation.  At this time patient did undergo MRI of head which revealed chronic ischemic changes, therefore it is recommended patient avoid strict blood pressure control.    Patient now presents accompanied by his daughter.  He is increased his physical activity and walking more regularly with his caregiver since last office visit.  Last office visit increased hydralazine to 50 mg 3 times daily.  Patient's blood pressure remains elevated with labile spikes as high as 170 mmHg systolic.  Discussed with patient and his daughter options of initiating spironolactone as patient's renal function has stabilized.  However patient and his daughter were hesitant to do so.  Therefore discussed option of initiating isosorbide mononitrate 30 mg once daily at night, patient and his daughter are agreeable to this.  Encouraged him to continue to monitor blood pressure on a regular basis and notify our office if it remains >150/90 mmHg.  We will continue to avoid strict blood pressure control given chronic ischemic changes on brain MRI.  Again given underlying dementia and advanced age patient and his family continue to wish to hold off on referral to electrophysiology as well as further ischemic evaluation at this time.  Follow-up in 6 months, sooner if needed.   Rayford Halsted, PA-C 05/10/2021, 2:55 PM Office:  [phone]

## 2021-05-10 ENCOUNTER — Other Ambulatory Visit: Payer: Self-pay

## 2021-05-10 ENCOUNTER — Ambulatory Visit: Payer: Medicare Other | Admitting: Student

## 2021-05-10 ENCOUNTER — Encounter: Payer: Self-pay | Admitting: Student

## 2021-05-10 VITALS — BP 150/58 | HR 40 | Temp 97.6°F | Ht 70.0 in | Wt 141.2 lb

## 2021-05-10 DIAGNOSIS — R001 Bradycardia, unspecified: Secondary | ICD-10-CM

## 2021-05-10 DIAGNOSIS — I10 Essential (primary) hypertension: Secondary | ICD-10-CM

## 2021-05-10 MED ORDER — ISOSORBIDE MONONITRATE ER 30 MG PO TB24: 30.0000 mg | ORAL_TABLET | Freq: Every evening | ORAL | 3 refills | Status: AC

## 2021-05-13 ENCOUNTER — Encounter: Payer: Self-pay | Admitting: Emergency Medicine

## 2021-05-13 ENCOUNTER — Emergency Department: Payer: Medicare Other

## 2021-05-13 ENCOUNTER — Other Ambulatory Visit: Payer: Self-pay

## 2021-05-13 ENCOUNTER — Emergency Department
Admission: EM | Admit: 2021-05-13 | Discharge: 2021-05-13 | Disposition: A | Discharge status: Home / Self Care | Payer: Medicare Other | Attending: Emergency Medicine | Admitting: Emergency Medicine

## 2021-05-13 ENCOUNTER — Ambulatory Visit
Admission: EM | Admit: 2021-05-13 | Discharge: 2021-05-13 | Disposition: A | Discharge status: Other Acute Inpatient Hospital | Payer: Medicare Other

## 2021-05-13 ENCOUNTER — Encounter: Payer: Self-pay | Admitting: General Practice

## 2021-05-13 DIAGNOSIS — I130 Hypertensive heart and chronic kidney disease with heart failure and stage 1 through stage 4 chronic kidney disease, or unspecified chronic kidney disease: Secondary | ICD-10-CM | POA: Insufficient documentation

## 2021-05-13 DIAGNOSIS — I509 Heart failure, unspecified: Secondary | ICD-10-CM | POA: Diagnosis not present

## 2021-05-13 DIAGNOSIS — S0083XA Contusion of other part of head, initial encounter: Secondary | ICD-10-CM | POA: Diagnosis not present

## 2021-05-13 DIAGNOSIS — F0150 Vascular dementia without behavioral disturbance: Secondary | ICD-10-CM | POA: Insufficient documentation

## 2021-05-13 DIAGNOSIS — R410 Disorientation, unspecified: Secondary | ICD-10-CM | POA: Diagnosis not present

## 2021-05-13 DIAGNOSIS — N3000 Acute cystitis without hematuria: Secondary | ICD-10-CM | POA: Diagnosis not present

## 2021-05-13 DIAGNOSIS — W1839XA Other fall on same level, initial encounter: Secondary | ICD-10-CM | POA: Diagnosis not present

## 2021-05-13 DIAGNOSIS — W19XXXA Unspecified fall, initial encounter: Secondary | ICD-10-CM

## 2021-05-13 DIAGNOSIS — N183 Chronic kidney disease, stage 3 unspecified: Secondary | ICD-10-CM | POA: Diagnosis not present

## 2021-05-13 DIAGNOSIS — Z79899 Other long term (current) drug therapy: Secondary | ICD-10-CM | POA: Insufficient documentation

## 2021-05-13 DIAGNOSIS — I2510 Atherosclerotic heart disease of native coronary artery without angina pectoris: Secondary | ICD-10-CM | POA: Insufficient documentation

## 2021-05-13 DIAGNOSIS — I4811 Longstanding persistent atrial fibrillation: Secondary | ICD-10-CM | POA: Diagnosis not present

## 2021-05-13 DIAGNOSIS — E1122 Type 2 diabetes mellitus with diabetic chronic kidney disease: Secondary | ICD-10-CM | POA: Insufficient documentation

## 2021-05-13 DIAGNOSIS — S0990XA Unspecified injury of head, initial encounter: Secondary | ICD-10-CM | POA: Diagnosis not present

## 2021-05-13 DIAGNOSIS — Z20822 Contact with and (suspected) exposure to covid-19: Secondary | ICD-10-CM | POA: Diagnosis not present

## 2021-05-13 DIAGNOSIS — M545 Low back pain, unspecified: Secondary | ICD-10-CM | POA: Diagnosis not present

## 2021-05-13 LAB — RESP PANEL BY RT-PCR (FLU A&B, COVID) ARPGX2
Influenza A by PCR: NEGATIVE
Influenza B by PCR: NEGATIVE
SARS Coronavirus 2 by RT PCR: NEGATIVE

## 2021-05-13 LAB — COMPREHENSIVE METABOLIC PANEL
ALT: 69 U/L — ABNORMAL HIGH (ref 0–44)
AST: 83 U/L — ABNORMAL HIGH (ref 15–41)
Albumin: 2.9 g/dL — ABNORMAL LOW (ref 3.5–5.0)
Alkaline Phosphatase: 147 U/L — ABNORMAL HIGH (ref 38–126)
Anion gap: 8 (ref 5–15)
BUN: 42 mg/dL — ABNORMAL HIGH (ref 8–23)
CO2: 23 mmol/L (ref 22–32)
Calcium: 9.4 mg/dL (ref 8.9–10.3)
Chloride: 105 mmol/L (ref 98–111)
Creatinine, Ser: 1.41 mg/dL — ABNORMAL HIGH (ref 0.61–1.24)
GFR, Estimated: 48 mL/min — ABNORMAL LOW (ref >60)
Glucose, Bld: 146 mg/dL — ABNORMAL HIGH (ref 70–99)
Potassium: 4.1 mmol/L (ref 3.5–5.1)
Sodium: 136 mmol/L (ref 135–145)
Total Bilirubin: 2.9 mg/dL — ABNORMAL HIGH (ref 0.3–1.2)
Total Protein: 6.3 g/dL — ABNORMAL LOW (ref 6.5–8.1)

## 2021-05-13 LAB — CBC WITH DIFFERENTIAL/PLATELET
Abs Immature Granulocytes: 0.01 10*3/uL (ref 0.00–0.07)
Basophils Absolute: 0 10*3/uL (ref 0.0–0.1)
Basophils Relative: 1 %
Eosinophils Absolute: 0 10*3/uL (ref 0.0–0.5)
Eosinophils Relative: 0 %
HCT: 39.8 % (ref 39.0–52.0)
Hemoglobin: 13.3 g/dL (ref 13.0–17.0)
Immature Granulocytes: 0 %
Lymphocytes Relative: 21 %
Lymphs Abs: 1.6 10*3/uL (ref 0.7–4.0)
MCH: 32.9 pg (ref 26.0–34.0)
MCHC: 33.4 g/dL (ref 30.0–36.0)
MCV: 98.5 fL (ref 80.0–100.0)
Monocytes Absolute: 0.8 10*3/uL (ref 0.1–1.0)
Monocytes Relative: 10 %
Neutro Abs: 5.2 10*3/uL (ref 1.7–7.7)
Neutrophils Relative %: 68 %
Platelets: 112 10*3/uL — ABNORMAL LOW (ref 150–400)
RBC: 4.04 MIL/uL — ABNORMAL LOW (ref 4.22–5.81)
RDW: 14.5 % (ref 11.5–15.5)
WBC: 7.6 10*3/uL (ref 4.0–10.5)
nRBC: 0 % (ref 0.0–0.2)

## 2021-05-13 LAB — URINALYSIS, COMPLETE (UACMP) WITH MICROSCOPIC
Bacteria, UA: NONE SEEN
Bilirubin Urine: NEGATIVE
Glucose, UA: NEGATIVE mg/dL
Hgb urine dipstick: NEGATIVE
Ketones, ur: NEGATIVE mg/dL
Nitrite: NEGATIVE
Protein, ur: 30 mg/dL — AB
Specific Gravity, Urine: 1.021 (ref 1.005–1.030)
WBC, UA: >50 WBC/hpf — ABNORMAL HIGH (ref 0–5)
pH: 5 (ref 5.0–8.0)

## 2021-05-13 LAB — TSH: TSH: 3.108 u[IU]/mL (ref 0.350–4.500)

## 2021-05-13 LAB — MAGNESIUM: Magnesium: 2.1 mg/dL (ref 1.7–2.4)

## 2021-05-13 MED ORDER — SODIUM CHLORIDE 0.9 % IV SOLN
1.0000 g | Freq: Once | INTRAVENOUS | Status: AC
  Administered 2021-05-13: 1 g via INTRAVENOUS
  Filled 2021-05-13: qty 10

## 2021-05-13 MED ORDER — CEPHALEXIN 500 MG PO CAPS: 500.0000 mg | ORAL_CAPSULE | Freq: Four times a day (QID) | ORAL | 0 refills | Status: AC

## 2021-05-13 MED ORDER — CEPHALEXIN 500 MG PO CAPS: 500.0000 mg | ORAL_CAPSULE | Freq: Four times a day (QID) | ORAL | 0 refills | Status: DC

## 2021-05-13 MED ORDER — IOHEXOL 350 MG/ML SOLN
75.0000 mL | Freq: Once | INTRAVENOUS | Status: AC | PRN
  Administered 2021-05-13: 75 mL via INTRAVENOUS
  Filled 2021-05-13: qty 75

## 2021-05-13 NOTE — ED Triage Notes (Signed)
Pt via POV from home. Pt is accompanied by daughter. Pt had a mechanical fall on Tuesday. Per daughter pt has been more lethargic and has been unbalance the past two days. Pt denies lightheadedness or dizziness, pt denies headache. Pt was seen at [facility] and sent here for CT scan. Pt has a small hematoma to the L side of his forehead.   Pt is has a hx of dementia, pt is alert but oriented to only self and place.

## 2021-05-13 NOTE — ED Triage Notes (Signed)
Had a fall 05/09/21 morning went to Dr. Jeannetta Nap office at [facility], no x-ray done, He hit his head. It was an unwitnessed fall, the patient said he was sore. The last few days,  has low energy, sleeps a lot, still sore. No headaches,. Took OTC Tylenol 500 mg x 4 times a day with little or no relief. Has low energy, drools too. Current symptoms are similar to what he had in the past Has dementia and CHF.

## 2021-05-13 NOTE — Discharge Instructions (Addendum)
Chest CT today showed: CTA chest shows no evidence of PE.  There is mild cardiomegaly and trace bilateral effusions without overt edema.  There is also some left lower lobe atelectasis versus possible early pneumonia.  There are some calcifications around the aortic leaflets and four-vessel CAD as well as aortic atherosclerosis without other acute thoracic process.  Head CT today showed: 1. No acute intracranial abnormality. 2. Chronic small left occipital infarct, right basal ganglia and left cerebellar infarcts. 3. Generalized brain parenchymal atrophy.  Neck CT today showed: 1. No evidence of acute traumatic injury to the cervical spine. 2. Multilevel osteoarthritic changes of the cervical spine.  L spine XR today showed: 1. No acute fracture or dislocation identified about the lumbosacral spine. 2. Multilevel osteoarthritic changes. 3. Heavy calcific atherosclerotic disease of the aorta.

## 2021-05-13 NOTE — ED Provider Notes (Signed)
[facility] Emergency Department Provider Note  ____________________________________________   Event Date/Time   First MD Initiated Contact with Patient 05/13/21 1645     (approximate)  I have reviewed the triage vital signs and the nursing notes.   HISTORY  Chief Complaint Fall   HPI Peter Martin is a 87 y.o. male with a past medical history of A. fib not on anticoagulation and chronic bradycardia with heart rates in the 40s to 50s at baseline per daughter, CAD, HTN, DM and vascular dementia as well as CKD who presents accompanied by daughter for assessment of increased weakness and some slowness after a fall on 10/4.  Patient receives 24-hour in-home care and briefly fell while getting out of bed although this was not witnessed.  Unclear if he had LOC but he was noted with bruising over his left head.  He was found by caregivers.  Patient denying any pain but was for to the ED by PCP.  Patient denies any acute symptoms but further history is limited from him secondary to his dementia.  Per daughter before fall couple days ago patient was able to ambulate with significant assistance but seems to be a little lower extremity little more assistance since the fall.  Other than a bruise on the left head.  Not aware of any other injuries and does not think he has had any new cough, fevers, vomiting, diarrhea or other clear sick symptoms.  No recent medication changes.  No other history is Ameeth available on patient presentation.         Past Medical History:  Diagnosis Date   Atrial fibrillation (HCC)    Coronary artery disease    Dementia (HCC)    Diabetes mellitus without complication (HCC)    Hypertension     Patient Active Problem List   Diagnosis Date Noted   Pressure injury of skin 03/08/2021   Acute CHF (congestive heart failure) (HCC) 03/07/2021   AF (paroxysmal atrial fibrillation) (HCC) 03/07/2021   CKD (chronic kidney disease) stage 3, GFR 30-59  ml/min (HCC) 03/07/2021   Hypertensive urgency 03/07/2021   Confusion 03/07/2021   Abnormal CT of the head 03/07/2021   Diabetes mellitus type 2 in nonobese (HCC) 03/07/2021   Dementia (HCC) 03/07/2021   Heart murmur 03/07/2021   A-fib (HCC) 05/11/2020   Angioectopia 05/11/2020    Past Surgical History:  Procedure Laterality Date   CORONARY ANGIOPLASTY      Prior to Admission medications   Medication Sig Start Date End Date Taking? Authorizing Provider  cephALEXin (KEFLEX) 500 MG capsule Take 1 capsule (500 mg total) by mouth 4 (four) times daily for 7 days. 05/13/21 05/20/21 Yes Gilles Chiquito, MD  acetaminophen (TYLENOL) 500 MG tablet Take 500 mg by mouth in the morning and at bedtime.    [provider]  donepezil (ARICEPT) 10 MG tablet Take 10 mg by mouth daily in the afternoon. 04/13/20   [provider]  furosemide (LASIX) 20 MG tablet Take 20 mg by mouth as needed.    [provider]  hydrALAZINE (APRESOLINE) 50 MG tablet Take 1 tablet (50 mg total) by mouth 3 (three) times daily. 04/12/21 07/11/21  Cantwell, Celeste C, PA-C  isosorbide mononitrate (IMDUR) 30 MG 24 hr tablet Take 1 tablet (30 mg total) by mouth at bedtime. 05/10/21 09/07/21  Cantwell, Celeste C, PA-C  lisinopril (ZESTRIL) 40 MG tablet Take 40 mg by mouth daily in the afternoon. 07/14/20   [provider]  ursodiol (ACTIGALL) 250 MG tablet Take 250 mg by mouth 2 (two) times daily. 03/22/21   [provider]    Allergies Patient has no known allergies.  Family History  Problem Relation Age of Onset   Heart disease Mother    Stroke Father 43    Social History Social History   Tobacco Use   Smoking status: Never   Smokeless tobacco: Never  Vaping Use   Vaping Use: Never used  Substance Use Topics   Alcohol use: Never   Drug use: Never    Review of Systems  Review of Systems  Unable to perform ROS: Dementia      ____________________________________________   PHYSICAL EXAM:  VITAL SIGNS: ED Triage Vitals  Enc Vitals Group     BP 05/13/21 1630 (!) 153/69     Pulse Rate 05/13/21 1630 (!) 50     Resp 05/13/21 1630 18     Temp 05/13/21 1630 98.2 F (36.8 C)     Temp Source 05/13/21 1630 Oral     SpO2 05/13/21 1630 96 %     Weight 05/13/21 1631 144 lb (65.3 kg)     Height 05/13/21 1631 5\' 9"  (1.753 m)     Head Circumference --      Peak Flow --      Pain Score --      Pain Loc --      Pain Edu? --      Excl. in GC? --    Vitals:   05/13/21 1630  BP: (!) 153/69  Pulse: (!) 50  Resp: 18  Temp: 98.2 F (36.8 C)  SpO2: 96%   Physical Exam Vitals and nursing note reviewed.  Constitutional:      Appearance: He is well-developed.  HENT:     Head: Normocephalic.     Right Ear: External ear normal.     Left Ear: External ear normal.     Nose: Nose normal.  Eyes:     Conjunctiva/sclera: Conjunctivae normal.  Cardiovascular:     Rate and Rhythm: Bradycardia present. Rhythm irregular.     Heart sounds: Murmur heard.  Pulmonary:     Effort: Pulmonary effort is normal. No respiratory distress.     Breath sounds: Normal breath sounds.  Abdominal:     Palpations: Abdomen is soft.     Tenderness: There is no abdominal tenderness.  Musculoskeletal:     Cervical back: Neck supple.  Skin:    General: Skin is warm and dry.     Capillary Refill: Capillary refill takes less than 2 seconds.  Neurological:     Mental Status: He is alert. Mental status is at baseline. He is disoriented and confused.  Psychiatric:        Mood and Affect: Mood normal.    There is a hematoma over the left forehead.  No other obvious trauma to the face scalp head or neck.  No tenderness step-offs or deformities over the C/T/L-spine.  Cranial nerves II through XII are otherwise grossly intact.  Patient is symmetric strength in his bilateral upper and lower extremities.  He is somewhat unsteady on his feet  requiring assistance to take a few steps from wheelchair to the bed.  There is no effusion deformity or tenderness or obvious evidence of trauma to the bilateral elbows, shoulders, wrists, hips, knees or ankles.  2+ radial pulses.  Sensation intact light touch throughout all extremities. ____________________________________________   LABS (all labs ordered are listed, but only abnormal  results are displayed)  Labs Reviewed  CBC WITH DIFFERENTIAL/PLATELET - Abnormal; Notable for the following components:      Result Value   RBC 4.04 (*)    Platelets 112 (*)    All other components within normal limits  COMPREHENSIVE METABOLIC PANEL - Abnormal; Notable for the following components:   Glucose, Bld 146 (*)    BUN 42 (*)    Creatinine, Ser 1.41 (*)    Total Protein 6.3 (*)    Albumin 2.9 (*)    AST 83 (*)    ALT 69 (*)    Alkaline Phosphatase 147 (*)    Total Bilirubin 2.9 (*)    GFR, Estimated 48 (*)    All other components within normal limits  URINALYSIS, COMPLETE (UACMP) WITH MICROSCOPIC - Abnormal; Notable for the following components:   Color, Urine AMBER (*)    APPearance HAZY (*)    Protein, ur 30 (*)    Leukocytes,Ua LARGE (*)    WBC, UA >50 (*)    All other components within normal limits  RESP PANEL BY RT-PCR (FLU A&B, COVID) ARPGX2  URINE CULTURE  TSH  MAGNESIUM   ____________________________________________  EKG  A. fib with a ventricular rate of 47, incomplete right bundle branch block, QTC normal at 479 without clear evidence of acute ischemia or significant arrhythmia. ____________________________________________  RADIOLOGY  ED MD interpretation: CT head shows no evidence of skull fracture, intracranial hemorrhage or other clear acute process although there is evidence of chronic small left occipital infarct and right basal ganglia and left cerebellar infarcts.  There is also evidence of generalized atrophy.  CT C-spine shows multilevel osteoarthritic changes  without evidence of acute injury.  CTA chest shows no evidence of PE.  There is mild cardiomegaly and trace bilateral effusions without overt edema.  There is also some left lower lobe atelectasis versus possible early pneumonia.  There are some calcifications around the aortic leaflets and four-vessel CAD as well as aortic atherosclerosis without other acute thoracic process.  Chest x-ray with opacity in the left retrocardiac space and small left-sided pleural effusion.  No overt edema, focal consolidation, pneumothorax or clear rib fracture.  Plain film of the L-spine shows heavily calcified aorta without evidence of acute fracture or dislocation of the L-spine.  Official radiology report(s): DG Chest 2 View  Result Date: 05/13/2021 CLINICAL DATA:  Fall and weakness EXAM: CHEST - 2 VIEW COMPARISON:  Chest x-ray 03/15/2021, CT abdomen pelvis 03/15/2021 FINDINGS: The heart and mediastinal contours are unchanged. Aortic calcification. Prominent main pulmonary artery. Subclavian and axillary vasculature calcifications. Question retrocardiac airspace opacity with at least trace volume pleural effusion. No pulmonary edema. No pleural effusion. No pneumothorax. No acute osseous abnormality. IMPRESSION: 1. Question retrocardiac airspace opacity with at least trace volume pleural effusion. Recommend CT chest with intravenous contrast for further evaluation. 2. Findings suggestive of pulmonary hypertension. 3.  Aortic Atherosclerosis (ICD10-I70.0). Electronically Signed   By: Tish Frederickson M.D.   On: 05/13/2021 18:28   DG Lumbar Spine Complete  Result Date: 05/13/2021 CLINICAL DATA:  Weakness and fall. EXAM: LUMBAR SPINE - COMPLETE 4+ VIEW COMPARISON:  None. FINDINGS: There is no evidence of lumbar spine fracture. Alignment is normal. Multilevel osteoarthritic changes. Heavy calcific atherosclerotic disease of the aorta. IMPRESSION: 1. No acute fracture or dislocation identified about the lumbosacral spine.  2. Multilevel osteoarthritic changes. 3. Heavy calcific atherosclerotic disease of the aorta. Electronically Signed   By: Ted Mcalpine M.D.   On: 05/13/2021  18:25   CT HEAD WO CONTRAST ( )  Result Date: 05/13/2021 CLINICAL DATA:  Head trauma. EXAM: CT HEAD WITHOUT CONTRAST TECHNIQUE: Contiguous axial images were obtained from the base of the skull through the vertex without intravenous contrast. COMPARISON:  March 07, 2021 FINDINGS: Brain: No evidence of acute infarction, hemorrhage, hydrocephalus, extra-axial collection or mass lesion/mass effect. Chronic small left occipital infarct. Small remote left cerebellar infarct. Small remote right basal ganglia infarct. Generalized brain atrophy. Vascular: Calcific atherosclerotic disease of the intracranial arteries. Skull: Normal. Negative for fracture or focal lesion. Sinuses/Orbits: No acute finding. Other: None. IMPRESSION: 1. No acute intracranial abnormality. 2. Chronic small left occipital infarct, right basal ganglia and left cerebellar infarcts. 3. Generalized brain parenchymal atrophy. Electronically Signed   By: Ted Mcalpine M.D.   On: 05/13/2021 17:53   CT Angio Chest PE W and/or Wo Contrast  Result Date: 05/13/2021 CLINICAL DATA:  Chest x-ray 05/13/2021. Cxr Question retrocardiac airspace opacity with at least trace volume pleural effusion. Fallen weakness. EXAM: CT ANGIOGRAPHY CHEST WITH CONTRAST TECHNIQUE: Multidetector CT imaging of the chest was performed using the standard protocol during bolus administration of intravenous contrast. Multiplanar CT image reconstructions and MIPs were obtained to evaluate the vascular anatomy. CONTRAST:  62mL OMNIPAQUE IOHEXOL 350 MG/ML SOLN COMPARISON:  Chest x-ray 03/07/2021, CT abdomen pelvis 03/15/2021, chest x-ray 05/13/2021 FINDINGS: Cardiovascular: Satisfactory opacification of the pulmonary arteries to the segmental level. No evidence of pulmonary embolism. Mild cardiomegaly. No  significant pericardial effusion. The thoracic aorta is normal in caliber. Severe atherosclerotic plaque of the thoracic aorta. Aortic valve leaflet calcifications. Four-vessel coronary artery calcifications. Mediastinum/Nodes: No enlarged mediastinal, hilar, or axillary lymph nodes. Thyroid gland, trachea, and esophagus demonstrate no significant findings. Lungs/Pleura: No focal consolidation. No pulmonary nodule. No pulmonary mass. Bilateral trace pleural effusions. No pneumothorax. Diffuse bronchial wall thickening. Upper Abdomen: No acute abnormality. Musculoskeletal: No chest wall abnormality. No suspicious lytic or blastic osseous lesions. No acute displaced fracture. Review of the MIP images confirms the above findings. IMPRESSION: 1. No pulmonary embolus. 2. Mild cardiomegaly. 3. Bilateral trace pleural effusions. 4. Left lower lobe atelectasis with superimposed developing infection/inflammation not excluded. 5. Aortic valve leaflet calcifications. Correlate with aortic stenosis. 6. Four-vessel coronary calcifications. 7.  Aortic Atherosclerosis (ICD10-I70.0). Electronically Signed   By: Tish Frederickson M.D.   On: 05/13/2021 19:12   CT Cervical Spine Wo Contrast  Result Date: 05/13/2021 CLINICAL DATA:  Neck trauma. EXAM: CT CERVICAL SPINE WITHOUT CONTRAST TECHNIQUE: Multidetector CT imaging of the cervical spine was performed without intravenous contrast. Multiplanar CT image reconstructions were also generated. COMPARISON:  None. FINDINGS: Alignment: Exaggerated cervical lordosis. Skull base and vertebrae: No acute fracture. No primary bone lesion or focal pathologic process. Soft tissues and spinal canal: No prevertebral fluid or swelling. No visible canal hematoma. Disc levels:  Multilevel osteoarthritic changes. Upper chest: Negative. Other: None. IMPRESSION: 1. No evidence of acute traumatic injury to the cervical spine. 2. Multilevel osteoarthritic changes of the cervical spine. Electronically  Signed   By: Ted Mcalpine M.D.   On: 05/13/2021 17:55    ____________________________________________   PROCEDURES  Procedure(s) performed (including Critical Care):  .1-3 Lead EKG Interpretation Performed by: Gilles Chiquito, MD Authorized by: Gilles Chiquito, MD     Interpretation: non-specific     ECG rate assessment: bradycardic     Rhythm: atrial fibrillation     ____________________________________________   INITIAL IMPRESSION / ASSESSMENT AND PLAN / ED COURSE     Patient presents with  above-stated history exam for assessment of some increased generalized weakness and more slowness to be a little more confusion after recent fall.  He was noted to have a bruise over his left head but no other injuries and was seen by his PCP who recommended he get a head CT today.  On arrival he is hypertensive with a BP of 153/69 and bradycardic at 50 with otherwise stable vital signs on room air.  He is at his neurological baseline as far as being oriented and has a nonfocal exam without other evidence of obvious trauma on exam aside from some bruising and hematoma of the left forehead.  Unclear to etiology for fall if this was presyncopal, syncopal or mechanical.  Given patient is able to write any significant history and age will obtain a CT head and C-spine to assess for skull fracture, intracranial hemorrhage or acute C-spine injury.  We will also look for evidence of mass-effect in the brain, ventriculomegaly or possible subacute CVA.  No acute neurological deficits appreciable on supine neuro exam to suggest an acute CVA.  Given unclear the etiology of the follow-up with patient noted to be more weaker and slower than usual we will obtain also an EKG basic screening labs and urine.  Will assess for metabolic derangements and anemia.  CT head shows no evidence of skull fracture, intracranial hemorrhage or other clear acute process although there is evidence of chronic small left  occipital infarct and right basal ganglia and left cerebellar infarcts.  There is also evidence of generalized atrophy.  CT C-spine shows multilevel osteoarthritic changes without evidence of acute injury.  CTA chest shows no evidence of PE.  There is mild cardiomegaly and trace bilateral effusions without overt edema.  There is also some left lower lobe atelectasis versus possible early pneumonia.  There are some calcifications around the aortic leaflets and four-vessel CAD as well as aortic atherosclerosis without other acute thoracic process.  Chest x-ray with opacity in the left retrocardiac space and small left-sided pleural effusion.  No overt edema, focal consolidation, pneumothorax or clear rib fracture.  A. fib with a ventricular rate of 47, incomplete right bundle branch block, QTC normal at 479 without clear evidence of acute ischemia or significant arrhythmia.  CMP shows stable kidney function, stable LFTs and T bili without clear evidence of acute other derangements.  COVID influenza PCR is negative.  Magnesium and TSH are within normal limits.  CBC shows no leukocytosis or acute anemia.  UA is concerning for cystitis with large leukocyte esterase and greater than 50 WBCs with some protein.  No blood.  Suspect UTI may be the etiology of patient's increased fatigue weakness and slowness of left couple days.  Seems bradycardia is not a new issue and has been chronic for some time for her daughter and review of records.  In addition patient daughter states that he was thought to have had an old stroke on prior CT I discussed further work-up of chronic strokes on CT today although this does not seem to be within goals of care.  On review of records it seems to have also chosen to forego anticoagulation from A. fib.  Patient given dose of Rocephin while pending full work-up in the emergency room.  However do not believe he is septic and I have low suspicion for pyelonephritis.  Given otherwise  reassuring exam and work-up with EKG and labs otherwise close to baseline I think he is stable for discharge with outpatient follow-up.  Rx written for Keflex.  Discharged in stable condition.       ____________________________________________   FINAL CLINICAL IMPRESSION(S) / ED DIAGNOSES  Final diagnoses:  Fall, initial encounter  Injury of head, initial encounter  Coronary artery disease involving native heart without angina pectoris, unspecified vessel or lesion type  Longstanding persistent atrial fibrillation (HCC)  Acute cystitis without hematuria    Medications  cefTRIAXone (ROCEPHIN) 1 g in sodium chloride 0.9 % 100 mL IVPB (has no administration in time range)  iohexol (OMNIPAQUE) 350 MG/ML injection 75 mL (75 mLs Intravenous Contrast Given 05/13/21 1846)     ED Discharge Orders          Ordered    cephALEXin (KEFLEX) 500 MG capsule  4 times daily        05/13/21 1921             Note:  This document was prepared using Dragon voice recognition software and may include unintentional dictation errors.    Gilles Chiquito, MD 05/13/21 [phone]

## 2021-05-13 NOTE — ED Provider Notes (Addendum)
[facility]    CSN: [id number] Arrival date & time: 05/13/21  1209      History   Chief Complaint Chief Complaint  Patient presents with   Fall    HPI Peter Martin is a 87 y.o. male.   Patient presents with caregiver and family member for further evaluation after a fall that occurred on 05/09/2021.  Family member reports that patient was getting up for bed and had a fall that resulted in him hitting his head.  Fall was unwitnessed.  EMS was called at time of fall but patient did not go to the hospital.  Patient was then seen by PCP the next day but no imaging was completed per family member.  Patient is having left lower back pain.  No other complaints of pain.  Patient has bruising noted to left side of forehead.  family member denies any complaints of headache, dizziness, blurred vision.  Although, caregiver and family member report excessive sleepiness, low energy, drooling that has been present since fall.  Denies any gait instabilities that are different from baseline.   Fall   Past Medical History:  Diagnosis Date   Atrial fibrillation [employer])    Coronary artery disease    Dementia (HCC)    Diabetes mellitus without complication (HCC)    Hypertension     Patient Active Problem List   Diagnosis Date Noted   Pressure injury of skin 03/08/2021   Acute CHF (congestive heart failure) (HCC) 03/07/2021   AF (paroxysmal atrial fibrillation) (HCC) 03/07/2021   CKD (chronic kidney disease) stage 3, GFR 30-59 ml/min (HCC) 03/07/2021   Hypertensive urgency 03/07/2021   Confusion 03/07/2021   Abnormal CT of the head 03/07/2021   Diabetes mellitus type 2 in nonobese (HCC) 03/07/2021   Dementia (HCC) 03/07/2021   Heart murmur 03/07/2021   A-fib (HCC) 05/11/2020   Angioectopia 05/11/2020    Past Surgical History:  Procedure Laterality Date   CORONARY ANGIOPLASTY         Home Medications    Prior to Admission medications   Medication Sig Start Date End Date  Taking? Authorizing Provider  acetaminophen (TYLENOL) 500 MG tablet Take 500 mg by mouth in the morning and at bedtime.   Yes [provider]  donepezil (ARICEPT) 10 MG tablet Take 10 mg by mouth daily in the afternoon. 04/13/20  Yes [provider]  hydrALAZINE (APRESOLINE) 50 MG tablet Take 1 tablet (50 mg total) by mouth 3 (three) times daily. 04/12/21 07/11/21 Yes Cantwell, Celeste C, PA-C  lisinopril (ZESTRIL) 40 MG tablet Take 40 mg by mouth daily in the afternoon. 07/14/20  Yes [provider]  ursodiol (ACTIGALL) 250 MG tablet Take 250 mg by mouth 2 (two) times daily. 03/22/21  Yes [provider]  furosemide (LASIX) 20 MG tablet Take 20 mg by mouth as needed.    [provider]  isosorbide mononitrate (IMDUR) 30 MG 24 hr tablet Take 1 tablet (30 mg total) by mouth at bedtime. 05/10/21 09/07/21  Cantwell, Renne Musca, PA-C    Family History Family History  Problem Relation Age of Onset   Heart disease Mother    Stroke Father 34    Social History Social History   Tobacco Use   Smoking status: Never   Smokeless tobacco: Never  Vaping Use   Vaping Use: Never used  Substance Use Topics   Alcohol use: Never   Drug use: Never     Allergies   Patient has no known  allergies.   Review of Systems Review of Systems Per HPI  Physical Exam Triage Vital Signs ED Triage Vitals  Enc Vitals Group     BP 05/13/21 1332 125/66     Pulse Rate 05/13/21 1332 (!) 43     Resp 05/13/21 1332 18     Temp 05/13/21 1332 (!) 97.5 F (36.4 C)     Temp Source 05/13/21 1332 Oral     SpO2 05/13/21 1332 95 %     Weight --      Height --      Head Circumference --      Peak Flow --      Pain Score 05/13/21 1341 10     Pain Loc --      Pain Edu? --      Excl. in GC? --    No data found.  Updated Vital Signs BP 125/66 (BP Location: Left Arm)   Pulse (!) 43   Temp (!) 97.5 F (36.4 C) (Oral)   Resp 18   SpO2 95%   Visual Acuity Right Eye  Distance:   Left Eye Distance:   Bilateral Distance:    Right Eye Near:   Left Eye Near:    Bilateral Near:     Physical Exam Constitutional:      General: He is not in acute distress.    Appearance: Normal appearance. He is not toxic-appearing or diaphoretic.  HENT:     Head: Normocephalic and atraumatic.     Right Ear: Tympanic membrane and ear canal normal.     Left Ear: Tympanic membrane and ear canal normal.  Eyes:     Extraocular Movements: Extraocular movements intact.     Conjunctiva/sclera: Conjunctivae normal.  Cardiovascular:     Rate and Rhythm: Regular rhythm. Bradycardia present.     Pulses: Normal pulses.     Heart sounds: Normal heart sounds.  Pulmonary:     Effort: Pulmonary effort is normal. No respiratory distress.     Breath sounds: No wheezing.  Musculoskeletal:     Cervical back: Normal.     Thoracic back: Normal.     Lumbar back: Tenderness present. No swelling, edema or bony tenderness. Negative right straight leg raise test and negative left straight leg raise test.       Back:     Comments: Tenderness to palpation to left lower lumbar region.  Skin:    General: Skin is warm and dry.     Comments: Patient has bruising noted to left forehead that is approximately 2 to 3 cm in diameter.  Swelling surrounding bruising.  No erythema or abrasions noted.  Neurological:     General: No focal deficit present.     Mental Status: He is alert. Mental status is at baseline.     Cranial Nerves: Cranial nerves are intact.     Sensory: Sensation is intact.     Comments: Motor function and stability at baseline.  Neuro exam at baseline.  Psychiatric:        Mood and Affect: Mood normal.        Behavior: Behavior normal.        Thought Content: Thought content normal.        Judgment: Judgment normal.     UC Treatments / Results  Labs (all labs ordered are listed, but only abnormal results are displayed) Labs Reviewed - No data to  display  EKG   Radiology No results found.  Procedures Procedures (including critical  care time)  Medications Ordered in UC Medications - No data to display  Initial Impression / Assessment and Plan / UC Course  I have reviewed the triage vital signs and the nursing notes.  Pertinent labs & imaging results that were available during my care of the patient were reviewed by me and considered in my medical decision making (see chart for details).     Patient and family member were advised to go to the hospital for further evaluation and management due to head injury and associated symptoms including excessive drowsiness, fatigue, drooling.  Patient will need imaging of the head to rule out worrisome etiologies.  Family member and caregiver were agreeable with plan.  Vital signs stable at discharge and neuro exam was normal.  Agree with patient's family member transporting patient to the hospital. Final Clinical Impressions(s) / UC Diagnoses   Final diagnoses:  Fall, initial encounter  Injury of head, initial encounter  Acute left-sided low back pain without sciatica     Discharge Instructions      Please go to the hospital as soon as you leave urgent care for further evaluation and management.     ED Prescriptions   None    PDMP not reviewed this encounter.   Lance Muss, FNP 05/13/21 1421    Lance Muss, FNP 05/13/21 1422

## 2021-05-13 NOTE — Discharge Instructions (Addendum)
Please go to the hospital as soon as you leave urgent care for further evaluation and management. 

## 2021-05-16 LAB — URINE CULTURE: Culture: >=100000 — AB

## 2021-06-14 ENCOUNTER — Telehealth: Payer: Self-pay

## 2021-06-14 NOTE — Telephone Encounter (Signed)
Pts daughter called with pt's BP readings   06-10-21  125/71 hr 46 161/83 hr 46  168/63 hr 45 144/80 hr 44  06/11/21 136/73 hr 48 133/54 hr 40 or less (wouldn't read on machine) 165/80 hr to low to read on machine 169/71 hr 48  06/12/21 137/67 hr 42 169/79 hr 46 149/68 hr 41 174/80 hr to low for machine to read  06/13/21 136/65  hr 40 159/63 hr 47 177/93 hr 49

## 2021-06-16 NOTE — Telephone Encounter (Signed)
Spoke to patient's daughter and advised her to have patient take additional dose of hydralazine as needed for blood pressure >160/90 mmHg.

## 2021-09-06 ENCOUNTER — Ambulatory Visit: Payer: Medicare Other | Admitting: Podiatry

## 2021-09-06 DEATH — deceased

## 2021-11-08 ENCOUNTER — Ambulatory Visit: Payer: Medicare Other | Admitting: Student

## 2022-08-07 IMAGING — US US RENAL
1 series · 14 of 25 positions shown · non-contrast
Comparison: No prior.

CLINICAL DATA: Chronic renal disease.

EXAM:
RENAL / URINARY TRACT ULTRASOUND COMPLETE

[Series 1: us renal · 0.16mm/px · 14 of 46 slices shown]
[im 1/46]
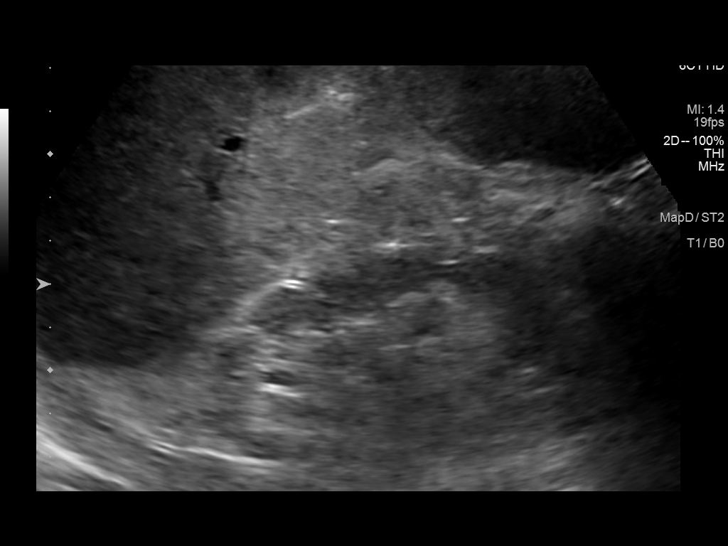
[im 4/46]
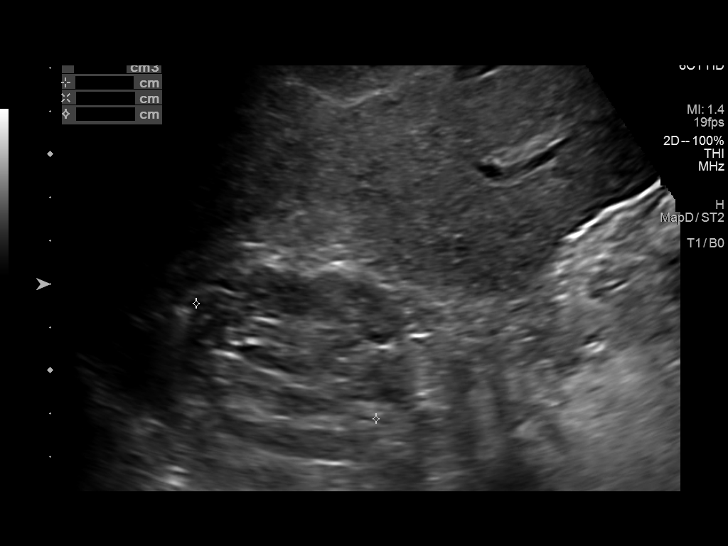
[im 8/46]
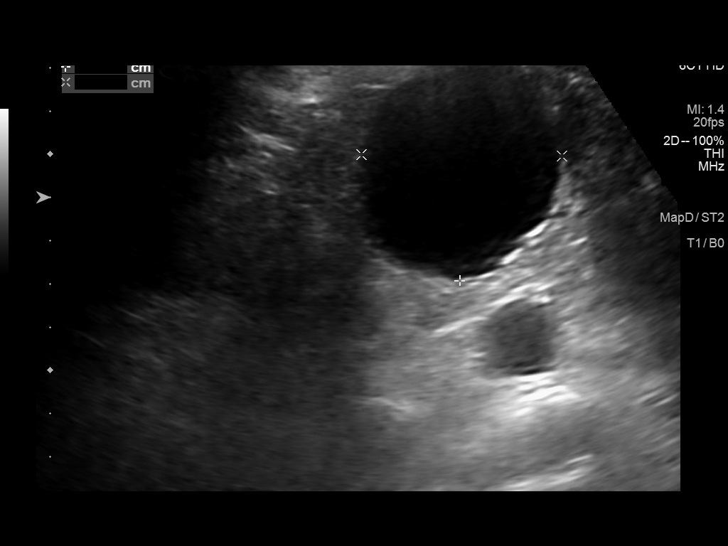
[im 12/46]
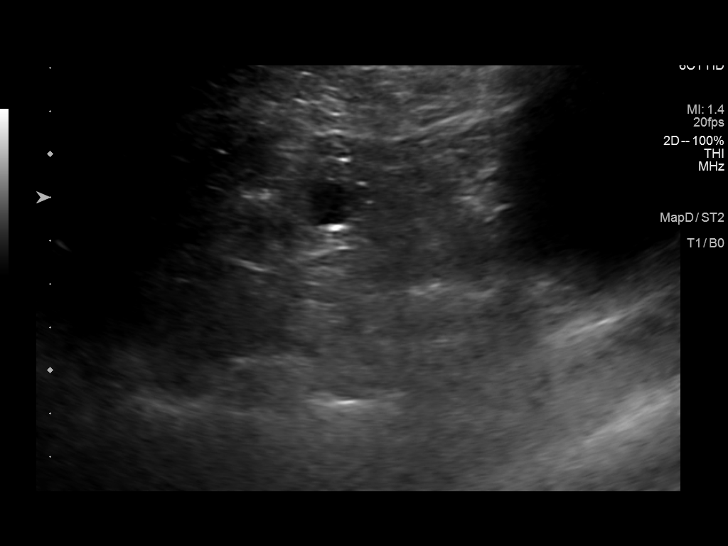
[im 16/46]
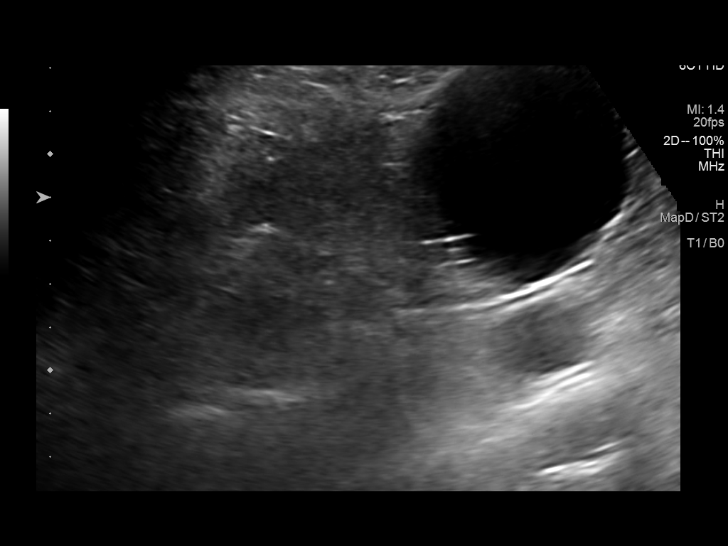
[im 17/46]
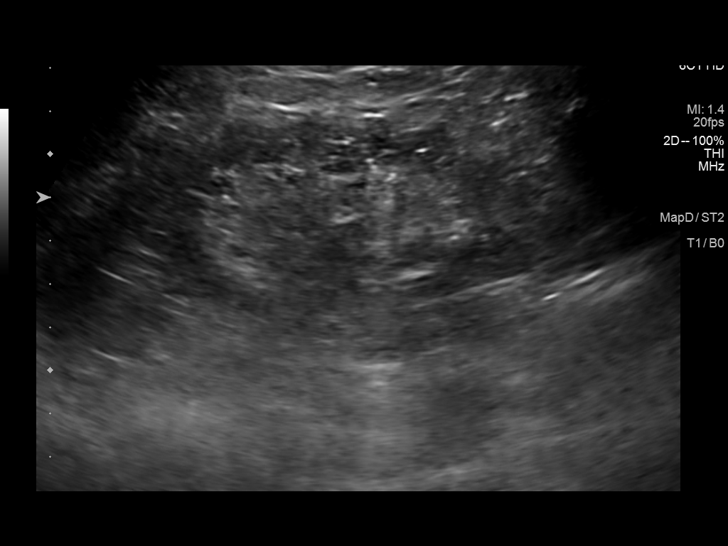
[im 21/46]
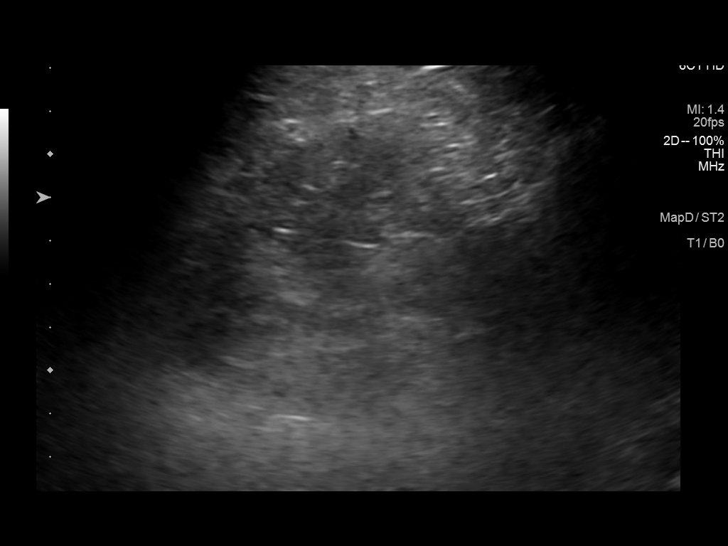
[im 25/46]
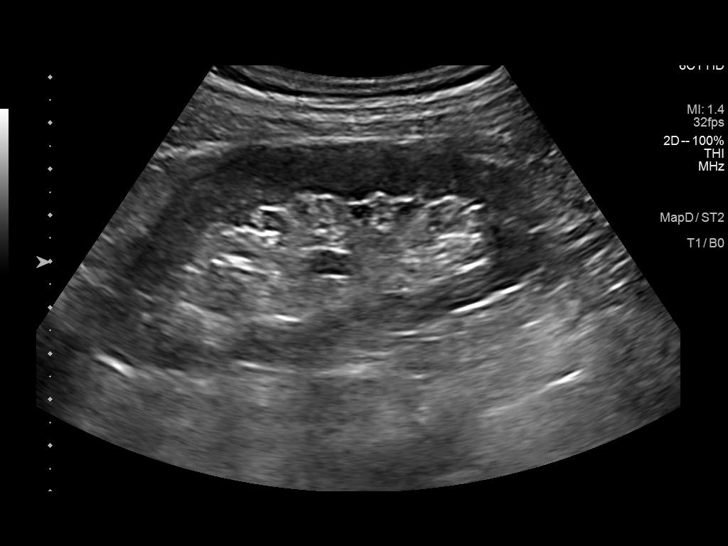
[im 29/46]
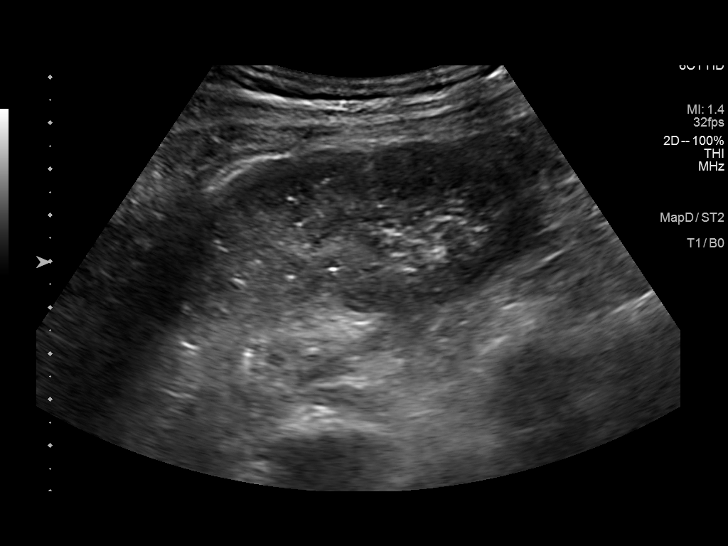
[im 31/46]
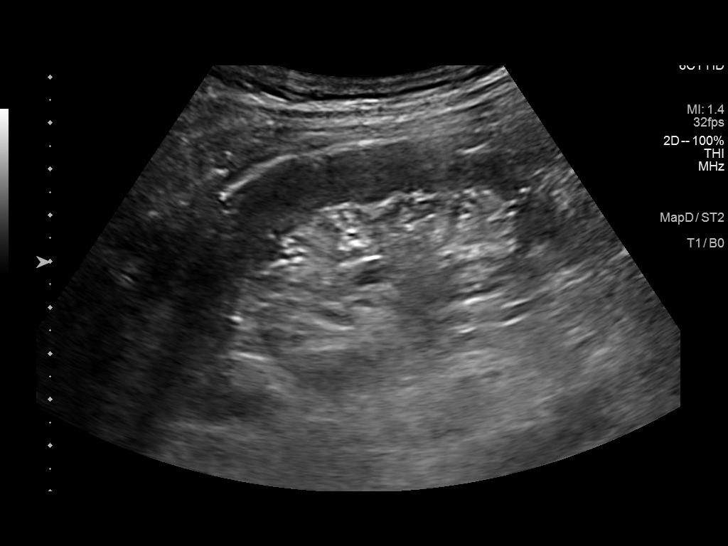
[im 34/46]
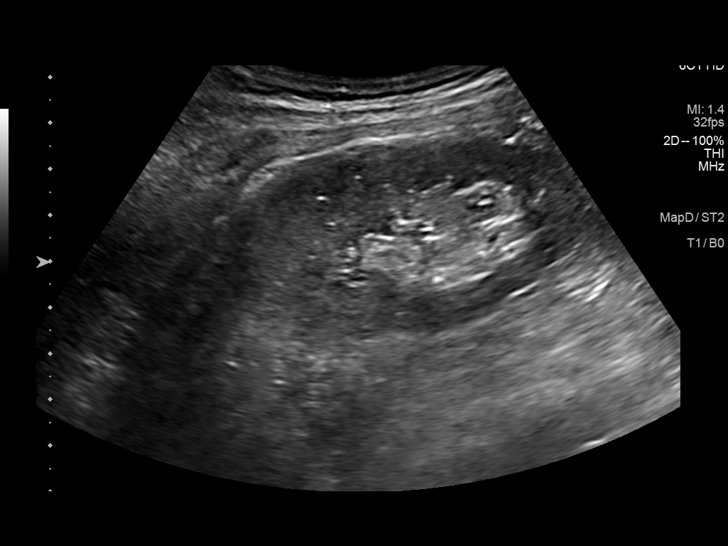
[im 38/46]
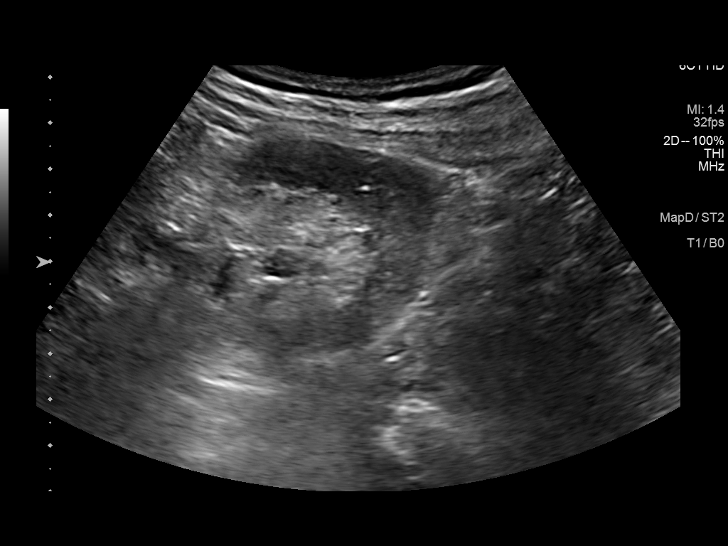
[im 42/46]
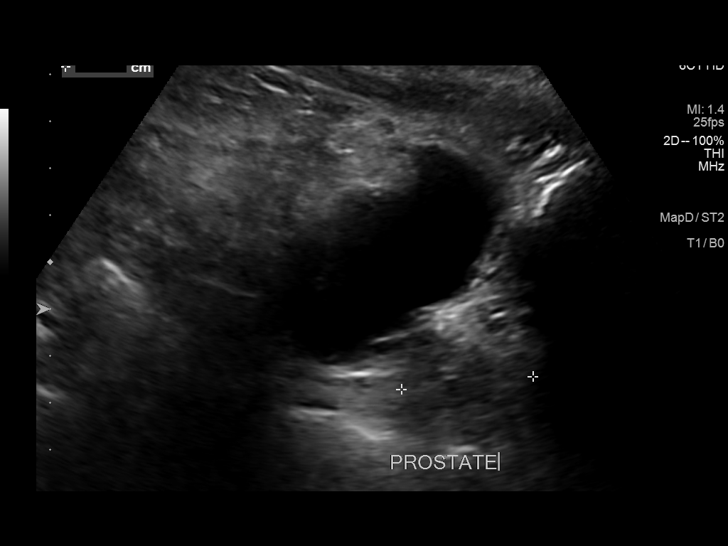
[im 46/46]
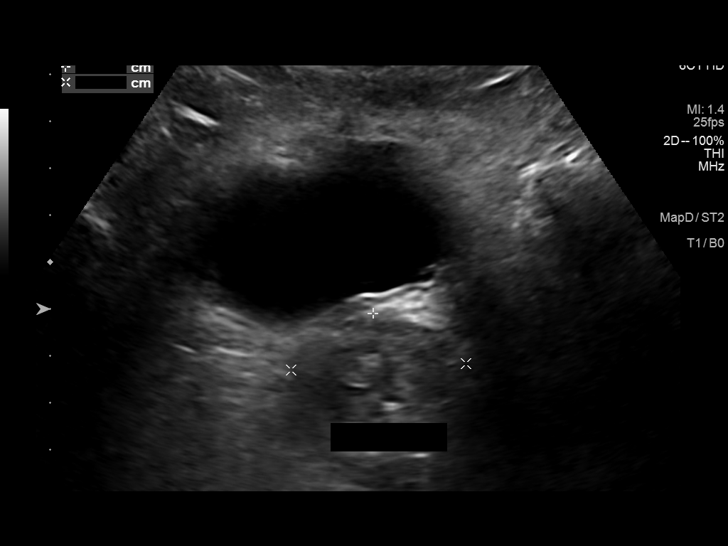

[14 of 25 positions shown; findings below may reference images not displayed]

FINDINGS: Right Kidney:

Renal measurements: 8.7 x 4.3 x 4.9 cm = volume: 95.7 mL. Cortical
thinning. 4.7 cm simple cyst. 1.4 cm thinly septated cyst most
likely benign. No hydronephrosis visualized.

Left Kidney:

Renal measurements: Size in 3 dimensions = volume: 107.2 mL.
Echogenicity within normal limits. No mass or hydronephrosis
visualized.

Bladder:

Appears normal for degree of bladder distention.

Other:

Prostate is heterogeneous in appearance.
IMPRESSION: 1. Right renal thinning. 4.7 cm right renal simple cyst. 1.4 cm
thinly septated probable benign right renal cyst. Left kidney
appears unremarkable. No hydronephrosis. No bladder distention.

2.  Prostate appears heterogeneous.

## 2022-08-22 IMAGING — RF DG ESOPHAGUS
4 series · 14 of 16 positions shown · non-contrast
Comparison: Esophagram 12/19/2007

CLINICAL DATA: Dysphagia.  History of esophageal dilatation.

EXAM:
ESOPHOGRAM/BARIUM SWALLOW
TECHNIQUE: Single contrast examination was performed using thin barium and
barium tablet.
FLUOROSCOPY TIME:  Fluoroscopy Time:  1 minutes 6 second
Radiation Exposure Index (if provided by the fluoroscopic device):
Number of Acquired Spot Images: 4

[Series 1: sequence · 0.28mm/px · 3 of 30 frames shown (1 of 3)]
[frame 5/30]
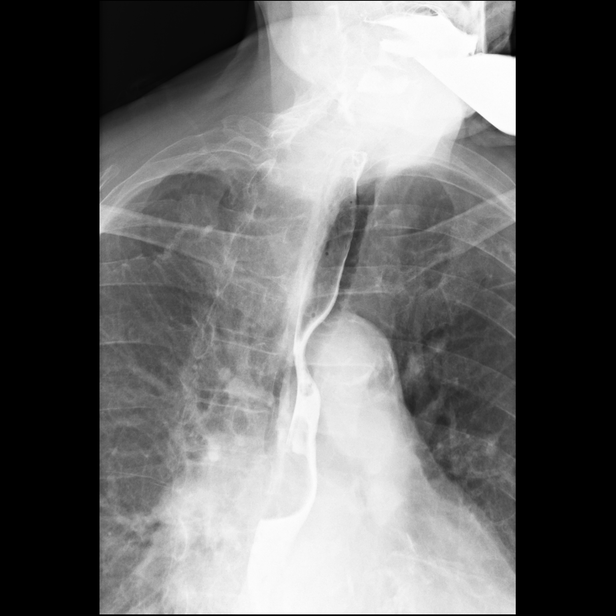
[frame 16/30]
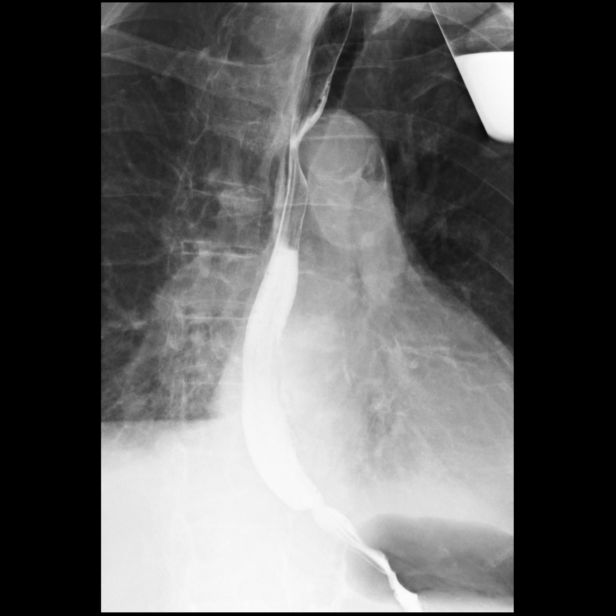
[frame 26/30]
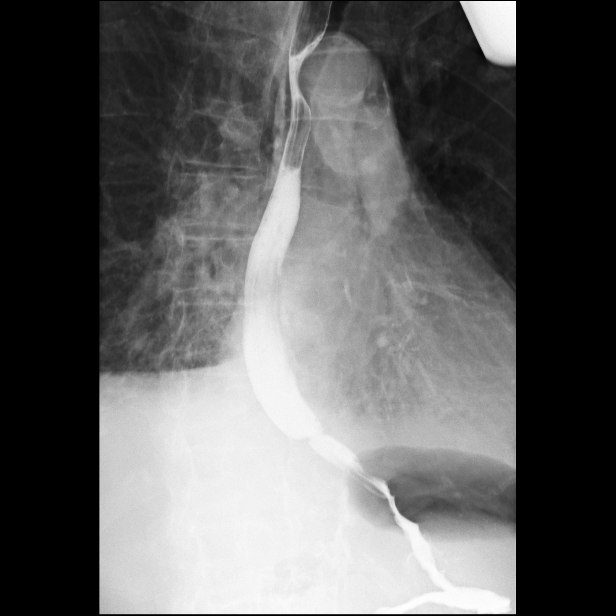

[Series 2: sequence · 0.28mm/px · 4 of 25 frames shown (2 of 3)]
[frame 4/25]
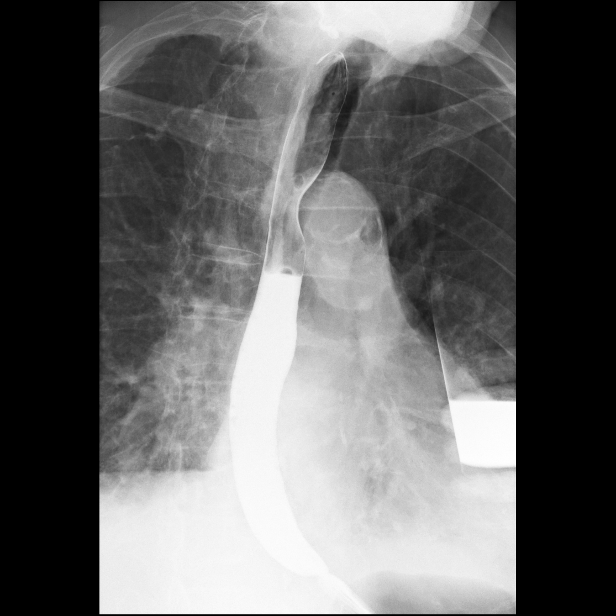
[frame 7/25]
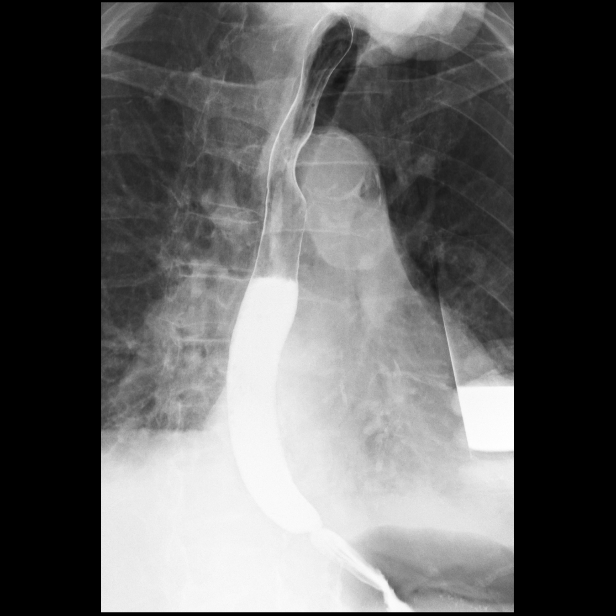
[frame 13/25]
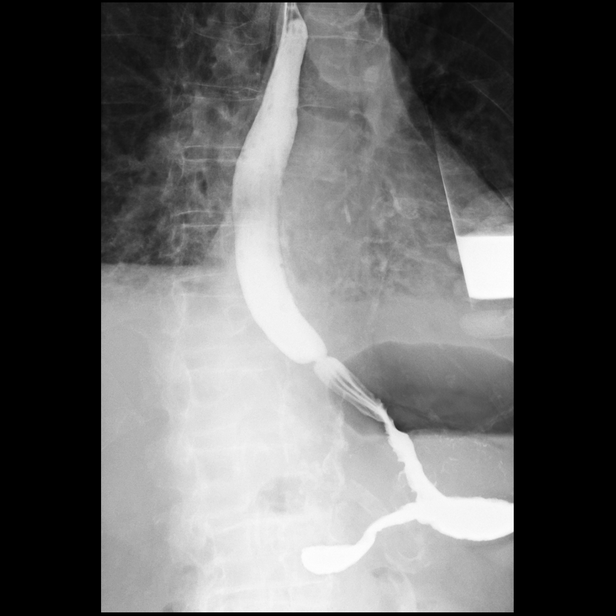
[frame 22/25]
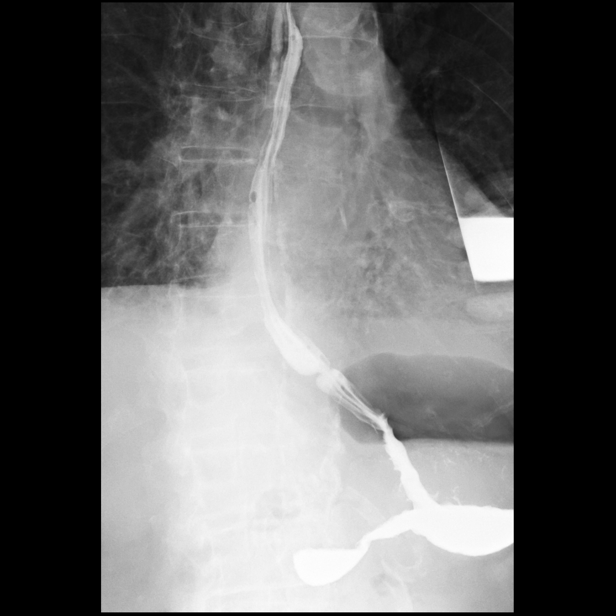

[Series 3: sequence · 0.28mm/px · 3 of 30 frames shown (3 of 3)]
[frame 5/30]
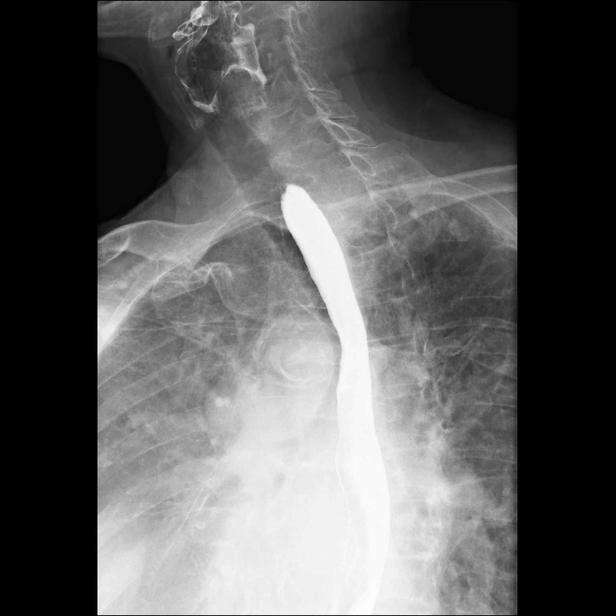
[frame 13/30]
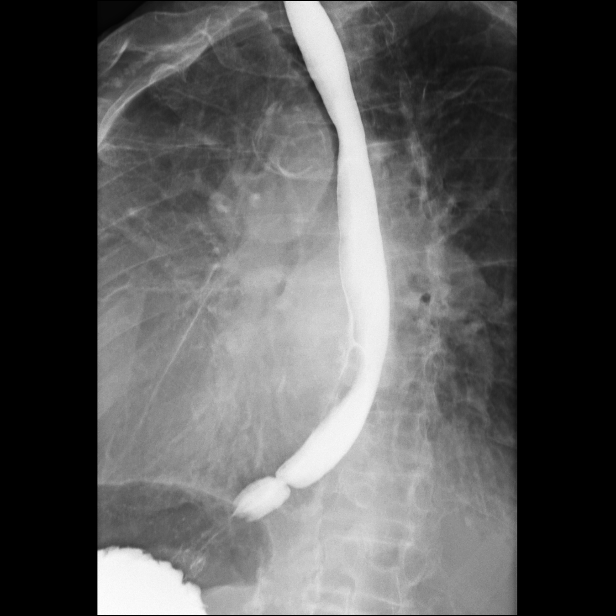
[frame 16/30]
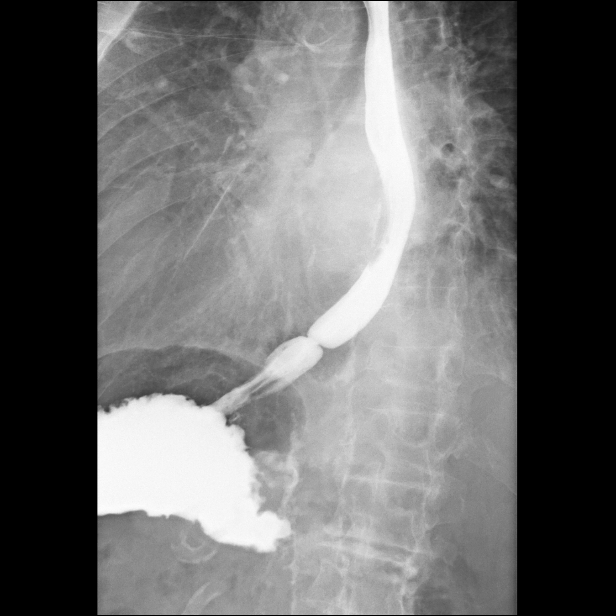

[Series 4: one shot · 4 of 4 slices shown]
[im 1/4]
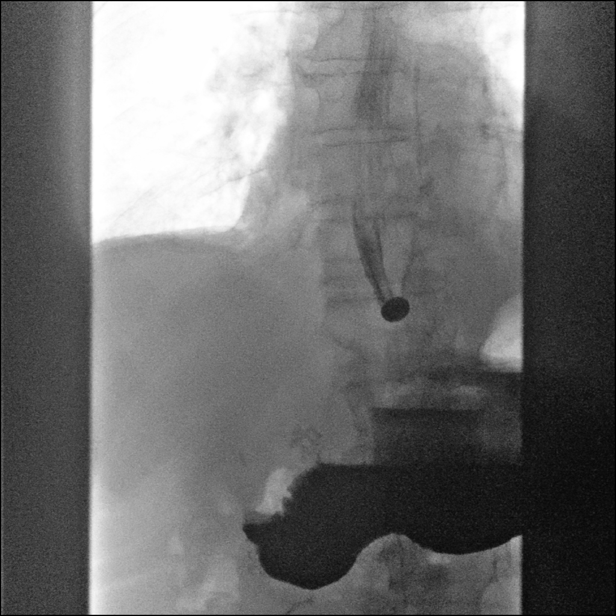
[im 2/4]
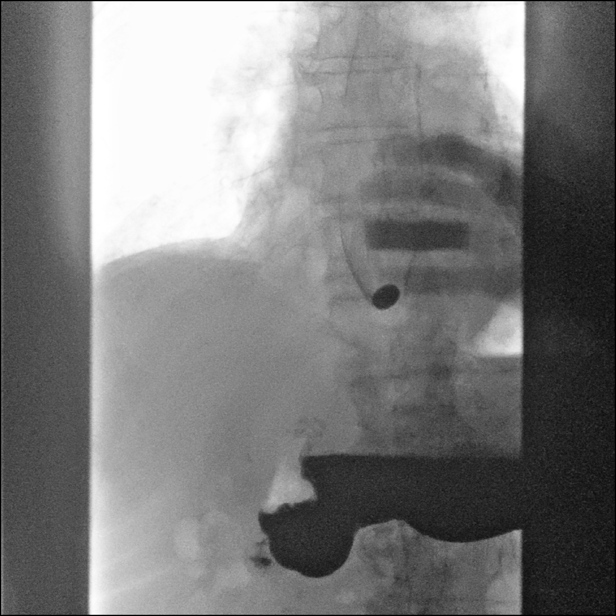
[im 3/4]
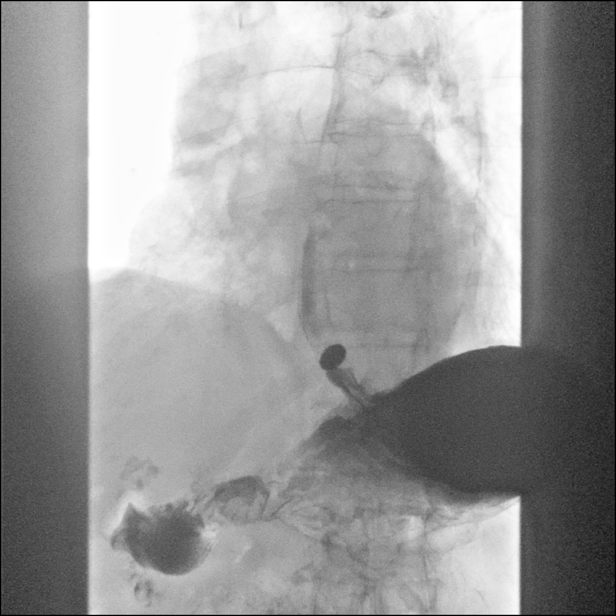
[im 4/4]
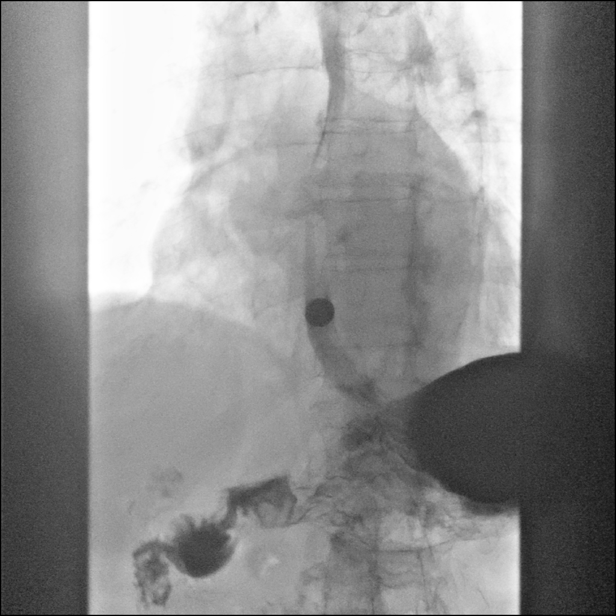

[14 of 16 positions shown; findings below may reference images not displayed]

FINDINGS: Pharyngeal function appears normal. No aspiration. Good esophageal
motility for age.

Small hiatal hernia. There is a short segment focal stricture of the
distal esophagus at the GE junction similar to that seen on the
prior study. This is moderately severe. Barium tablet did not pass
through the stricture.

There is mild gastroesophageal reflux.
IMPRESSION: Moderately severe benign-appearing stricture at the GE junction.
Mild gastroesophageal reflux with small hiatal hernia.
